# Patient Record
Sex: Male | Born: 1935 | Race: White | Hispanic: No | Marital: Married | Smoking: Never smoker
Health system: Southern US, Community
[De-identification: ages and names within clinical notes are randomized; demographics above are authoritative.]

## PROBLEM LIST (undated history)

## (undated) DIAGNOSIS — I219 Acute myocardial infarction, unspecified: Secondary | ICD-10-CM

## (undated) DIAGNOSIS — Z789 Other specified health status: Secondary | ICD-10-CM

## (undated) HISTORY — PX: LUNG LOBECTOMY: SHX167

## (undated) HISTORY — PX: CORONARY ARTERY BYPASS GRAFT: SHX141

## (undated) HISTORY — DX: Other specified health status: Z78.9

## (undated) HISTORY — DX: Acute myocardial infarction, unspecified: I21.9

---

## 2018-03-26 DIAGNOSIS — I739 Peripheral vascular disease, unspecified: Secondary | ICD-10-CM | POA: Insufficient documentation

## 2018-03-26 DIAGNOSIS — M545 Low back pain, unspecified: Secondary | ICD-10-CM | POA: Insufficient documentation

## 2018-03-26 DIAGNOSIS — E039 Hypothyroidism, unspecified: Secondary | ICD-10-CM | POA: Insufficient documentation

## 2018-03-26 DIAGNOSIS — I10 Essential (primary) hypertension: Secondary | ICD-10-CM | POA: Insufficient documentation

## 2018-03-26 DIAGNOSIS — F4310 Post-traumatic stress disorder, unspecified: Secondary | ICD-10-CM | POA: Insufficient documentation

## 2018-03-26 DIAGNOSIS — I2510 Atherosclerotic heart disease of native coronary artery without angina pectoris: Secondary | ICD-10-CM | POA: Insufficient documentation

## 2018-03-26 DIAGNOSIS — R55 Syncope and collapse: Secondary | ICD-10-CM | POA: Insufficient documentation

## 2018-03-26 DIAGNOSIS — E785 Hyperlipidemia, unspecified: Secondary | ICD-10-CM | POA: Insufficient documentation

## 2018-03-26 DIAGNOSIS — Z951 Presence of aortocoronary bypass graft: Secondary | ICD-10-CM | POA: Insufficient documentation

## 2018-09-14 ENCOUNTER — Other Ambulatory Visit: Payer: Self-pay | Admitting: Family Medicine

## 2018-09-14 ENCOUNTER — Other Ambulatory Visit (HOSPITAL_COMMUNITY): Payer: Self-pay | Admitting: Family Medicine

## 2018-09-14 DIAGNOSIS — I6529 Occlusion and stenosis of unspecified carotid artery: Secondary | ICD-10-CM

## 2018-09-28 ENCOUNTER — Other Ambulatory Visit: Payer: Self-pay

## 2018-09-28 ENCOUNTER — Ambulatory Visit (HOSPITAL_COMMUNITY)
Admission: RE | Admit: 2018-09-28 | Discharge: 2018-09-28 | Disposition: A | Discharge status: Home / Self Care | Payer: No Typology Code available for payment source | Source: Ambulatory Visit | Attending: Family Medicine | Admitting: Family Medicine

## 2018-09-28 DIAGNOSIS — I6529 Occlusion and stenosis of unspecified carotid artery: Secondary | ICD-10-CM | POA: Diagnosis present

## 2020-01-23 IMAGING — US BILATERAL CAROTID DUPLEX ULTRASOUND
1 series · 13 of 24 positions shown · non-contrast
Comparison: None.

CLINICAL DATA: Coronary artery disease, hyperlipidemia, previous
tobacco abuse, evaluate for stenosis

EXAM:
BILATERAL CAROTID DUPLEX ULTRASOUND
TECHNIQUE: Gray scale imaging, color Doppler and duplex ultrasound were
performed of bilateral carotid and vertebral arteries in the neck.

[Series 1: bilateral carotid duplex ultrasound · 13 of 68 slices shown]
[im 1/68]
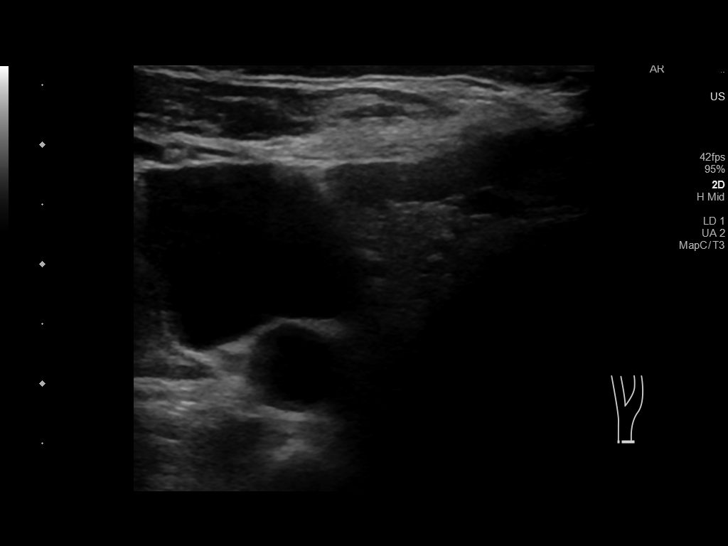
[im 6/68]
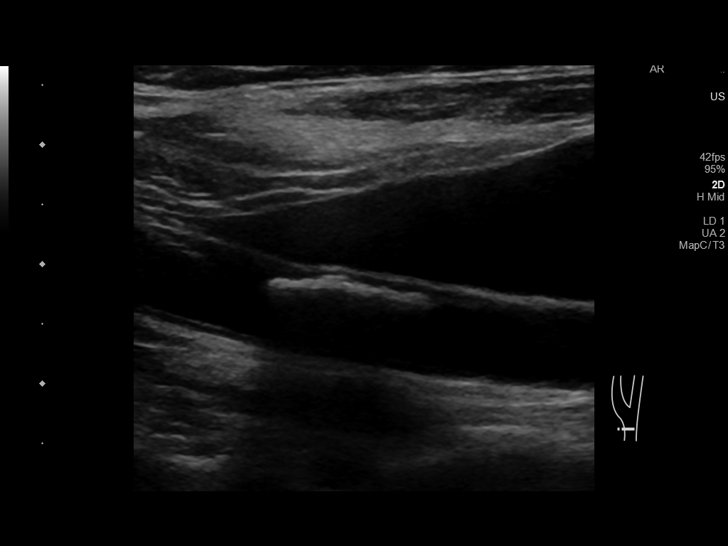
[im 12/68]
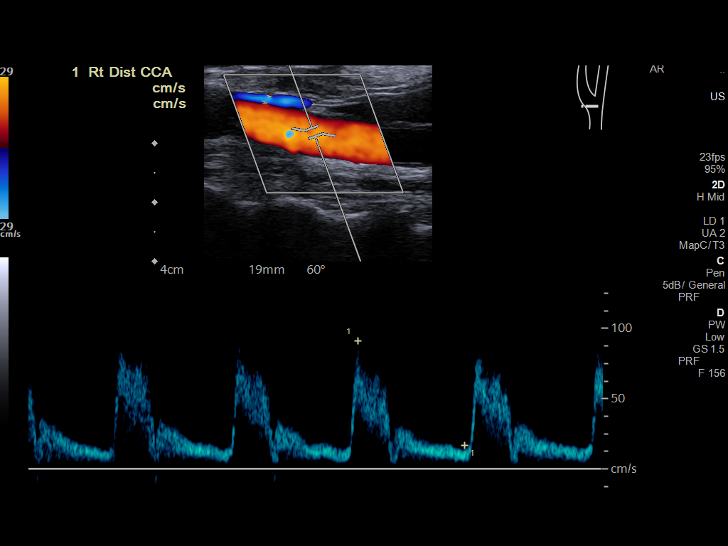
[im 18/68]
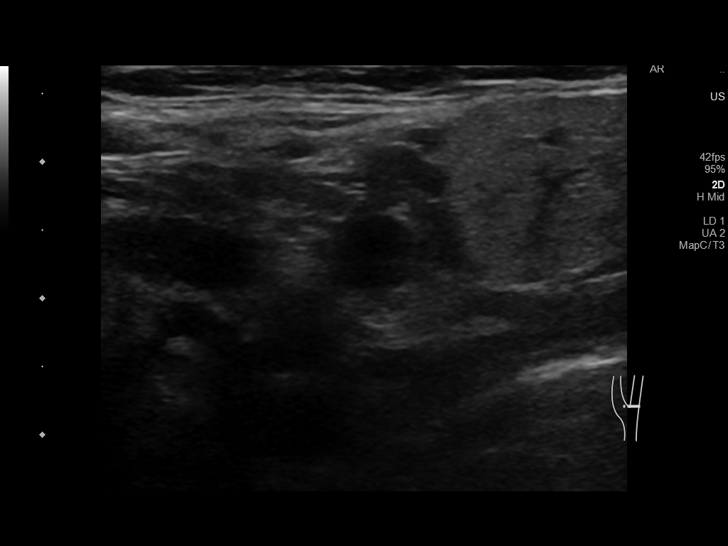
[im 24/68]
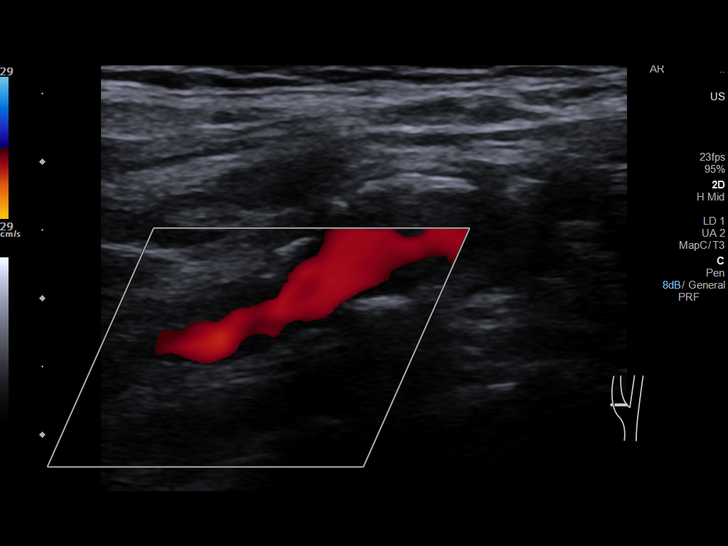
[im 30/68]
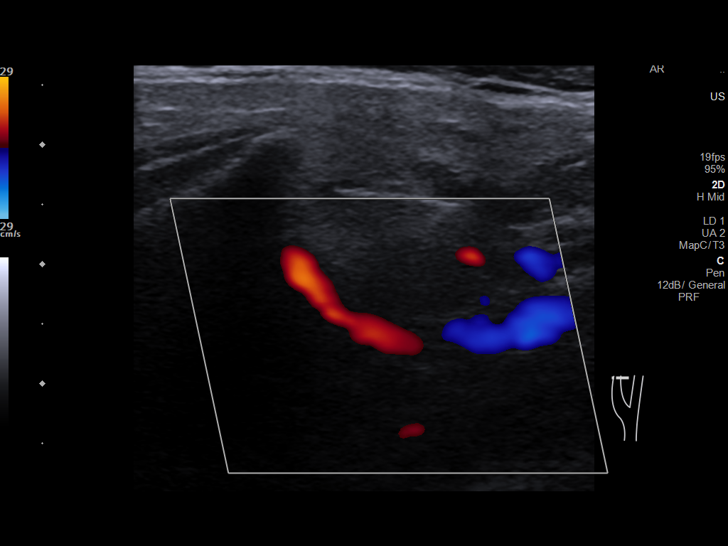
[im 35/68]
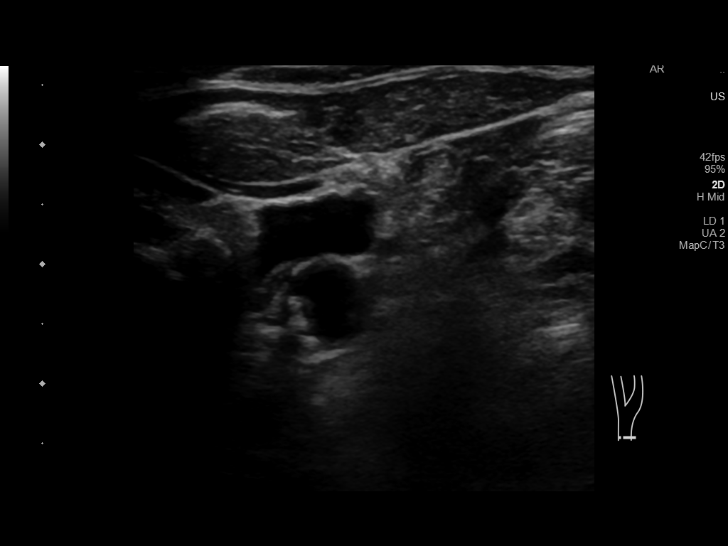
[im 38/68]
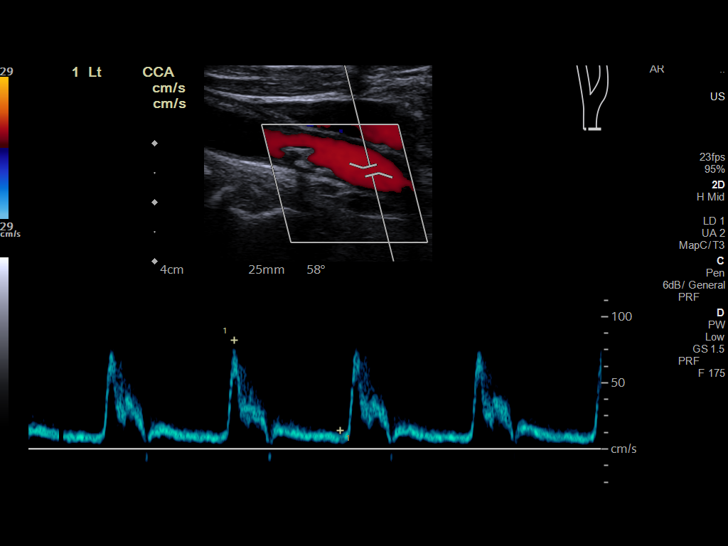
[im 44/68]
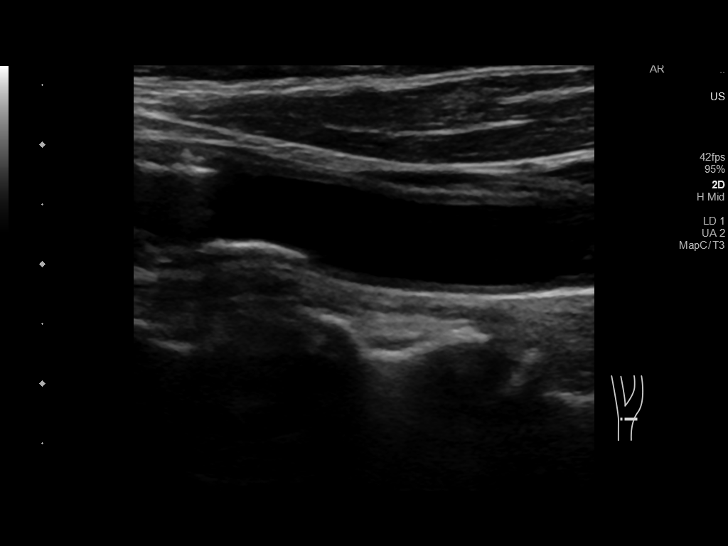
[im 50/68]
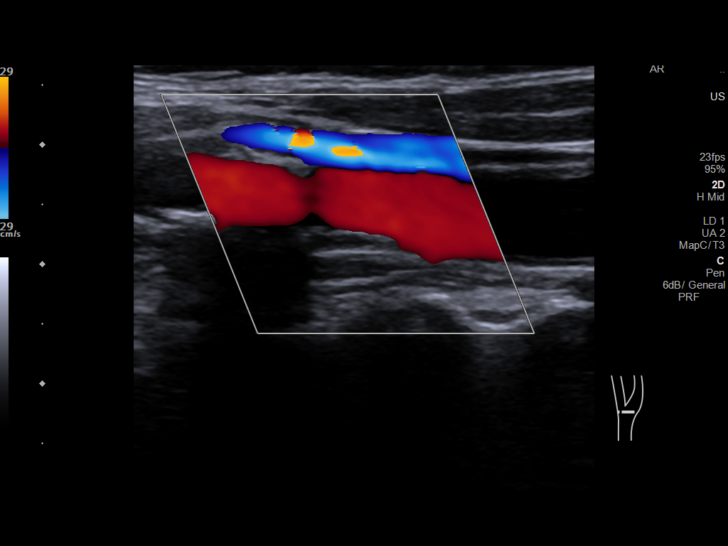
[im 56/68]
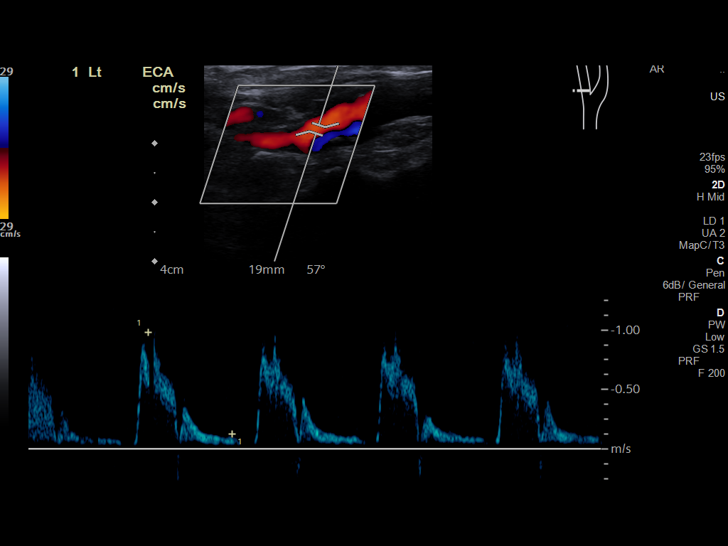
[im 62/68]
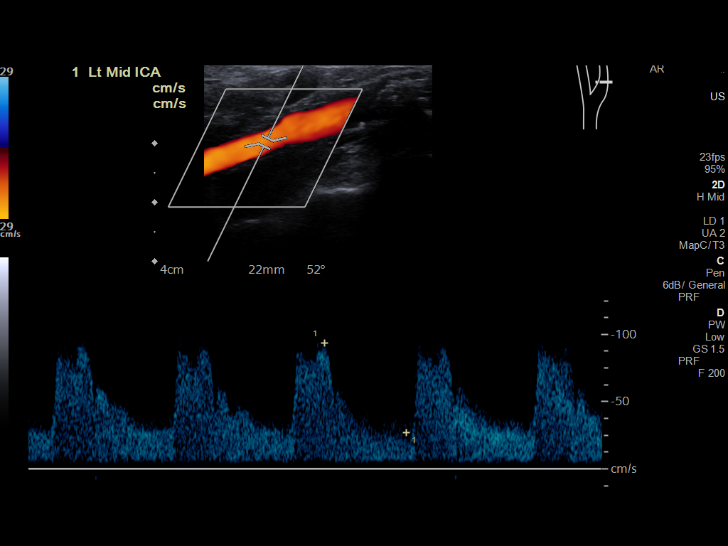
[im 68/68]
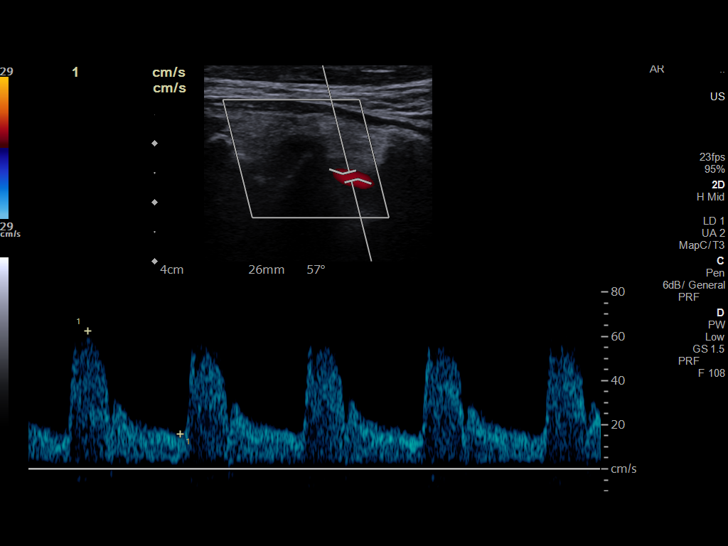

[13 of 24 positions shown; findings below may reference images not displayed]

FINDINGS: Criteria: Quantification of carotid stenosis is based on velocity
parameters that correlate the residual internal carotid diameter
with NASCET-based stenosis levels, using the diameter of the distal
internal carotid lumen as the denominator for stenosis measurement.

The following velocity measurements were obtained:

RIGHT

ICA: 120/31 cm/sec

CCA: 81/14 cm/sec

SYSTOLIC ICA/CCA RATIO:

ECA: 175 cm/sec

LEFT

ICA: 112/38 cm/sec

CCA: 98/21 cm/sec

SYSTOLIC ICA/CCA RATIO:

ECA: 98 cm/sec

RIGHT CAROTID ARTERY: Eccentric nonocclusive calcified plaque in the
mid and distal common carotid artery. Moderate calcified plaque in
the bulb extending into proximal internal and external carotid
arteries resulting in at least mild stenosis. Normal waveforms and
color Doppler signal throughout however. Mildly tortuous ICA.

RIGHT VERTEBRAL ARTERY:  Normal flow direction and waveform.

LEFT CAROTID ARTERY: Eccentric partially calcified plaque in the
proximal common carotid artery resulting in at least mild stenosis.
Partially calcified plaque in the bulb extending to the ICA origin
resulting in at least mild focal stenosis. Normal waveforms and
color Doppler signal throughout however.

LEFT VERTEBRAL ARTERY:  Normal flow direction and waveform.
IMPRESSION: 1. Bilateral carotid bifurcation plaque resulting in less than 50%
diameter ICA stenosis.
2. Antegrade bilateral vertebral arterial flow.

## 2021-10-25 ENCOUNTER — Other Ambulatory Visit: Payer: Self-pay | Admitting: *Deleted

## 2021-10-25 DIAGNOSIS — K431 Incisional hernia with gangrene: Secondary | ICD-10-CM

## 2021-10-28 ENCOUNTER — Ambulatory Visit (INDEPENDENT_AMBULATORY_CARE_PROVIDER_SITE_OTHER): Payer: No Typology Code available for payment source | Admitting: General Surgery

## 2021-10-28 ENCOUNTER — Encounter: Payer: Self-pay | Admitting: General Surgery

## 2021-10-28 VITALS — BP 147/81 | HR 76 | Temp 98.0°F | Resp 18 | Ht 68.0 in | Wt 168.0 lb

## 2021-10-28 DIAGNOSIS — K4090 Unilateral inguinal hernia, without obstruction or gangrene, not specified as recurrent: Secondary | ICD-10-CM | POA: Diagnosis not present

## 2021-10-28 NOTE — Progress Notes (Signed)
Luis Fletcher; [id number]; Aug 24, 1929   HPI Patient is an 88 year old white male who was referred to my care by Dr. Jolaine Click of the [facility] for evaluation and treatment of a right inguinal hernia.  Patient states he recently noted the hernia this summer.  It is somewhat uncomfortable, but he is able to get along with his daily lifestyle.  He denies any nausea or vomiting.  No history of incarceration has been noted.  He does have a cardiac history, though any recent work-up or follow-up is limited.  He denies any recent shortness of breath, chest pain, or dyspnea.  He has had some dizziness especially in the evening when sitting up or standing from a sitting position. Past Medical History:  Diagnosis Date   Myocardial infarction (HCC)    Refusal of blood product     Past Surgical History:  Procedure Laterality Date   CORONARY ARTERY BYPASS GRAFT     LUNG LOBECTOMY Left     History reviewed. No pertinent family history.  Current Outpatient Medications on File Prior to Visit  Medication Sig Dispense Refill   aspirin EC 81 MG tablet ONE TABLET BY MOUTH EVERY DAY     Cholecalciferol 100 MCG (4000 UT) CAPS Take 1 tablet by mouth daily.     levothyroxine (SYNTHROID) 100 MCG tablet TAKE ONE TABLET BY MOUTH EVERY MORNING FOR THYROID. RECHECK THYROID LEVELS IN 6-8 WEEKS     PARoxetine (PAXIL) 30 MG tablet Take 1 tablet by mouth at bedtime.     No current facility-administered medications on file prior to visit.    Allergies  Allergen Reactions   Penicillin G    Sulfa Antibiotics Rash    Social History   Substance and Sexual Activity  Alcohol Use Never    Social History   Tobacco Use  Smoking Status Never   Passive exposure: Never  Smokeless Tobacco Never    Review of Systems  Constitutional: Negative.   HENT: Negative.    Eyes: Negative.   Respiratory: Negative.    Cardiovascular: Negative.   Gastrointestinal: Negative.   Genitourinary:   Positive for frequency.  Musculoskeletal:  Positive for back pain, joint pain and neck pain.  Skin: Negative.   Neurological:  Positive for dizziness.  Endo/Heme/Allergies: Negative.   Psychiatric/Behavioral: Negative.      Objective   Vitals:   10/28/21 1109  BP: (!) 147/81  Pulse: 76  Resp: 18  Temp: 98 F (36.7 C)  SpO2: 96%    Physical Exam Vitals reviewed.  Constitutional:      Appearance: Normal appearance. He is normal weight. He is not ill-appearing.  HENT:     Head: Normocephalic and atraumatic.  Cardiovascular:     Rate and Rhythm: Normal rate and regular rhythm.     Heart sounds: Normal heart sounds. No murmur heard.    No friction rub. No gallop.  Pulmonary:     Effort: Pulmonary effort is normal. No respiratory distress.     Breath sounds: Normal breath sounds. No stridor. No wheezing, rhonchi or rales.  Abdominal:     General: Bowel sounds are normal. There is no distension.     Palpations: Abdomen is soft. There is no mass.     Tenderness: There is no abdominal tenderness. There is no guarding or rebound.     Hernia: A hernia is present.     Comments: An easily reducible right inguinal hernia is noted.  Genitourinary:    Testes: Normal.  Skin:  General: Skin is warm and dry.  Neurological:     Mental Status: He is alert and oriented to person, place, and time.    [facility] reviewed Assessment  Minimally symptomatic right inguinal hernia New onset dizziness, history of cardiac disease, hypertension Plan  I told the patient that I do not recommend hernia repair unless absolutely necessary and he is more symptomatic.  He is going to get a cardiac evaluation in the near future.  I told him to have the cardiologist send me any work-up they may do.  Further surgical intervention is dependent on how symptomatic he is and what his cardiac status is.  He understands this and agrees.  Literature was given.  Instructions on how to reduce an incarcerated hernia  were given.  Follow-up here expectantly.

## 2023-02-15 ENCOUNTER — Other Ambulatory Visit (HOSPITAL_COMMUNITY): Payer: Self-pay | Admitting: Internal Medicine

## 2023-02-15 DIAGNOSIS — K4090 Unilateral inguinal hernia, without obstruction or gangrene, not specified as recurrent: Secondary | ICD-10-CM

## 2023-03-02 ENCOUNTER — Ambulatory Visit (HOSPITAL_COMMUNITY)
Admission: RE | Admit: 2023-03-02 | Discharge: 2023-03-02 | Disposition: A | Discharge status: Home / Self Care | Payer: No Typology Code available for payment source | Source: Ambulatory Visit | Attending: Internal Medicine | Admitting: Internal Medicine

## 2023-03-02 DIAGNOSIS — K4090 Unilateral inguinal hernia, without obstruction or gangrene, not specified as recurrent: Secondary | ICD-10-CM | POA: Insufficient documentation

## 2023-03-02 MED ORDER — IOHEXOL 300 MG/ML  SOLN
100.0000 mL | Freq: Once | INTRAMUSCULAR | Status: AC | PRN
  Administered 2023-03-02: 100 mL via INTRAVENOUS

## 2023-10-25 ENCOUNTER — Emergency Department (HOSPITAL_COMMUNITY)

## 2023-10-25 ENCOUNTER — Inpatient Hospital Stay (HOSPITAL_COMMUNITY)
Admission: EM | Admit: 2023-10-25 | Discharge: 2023-10-27 | DRG: 871 | Disposition: A | Discharge status: Home / Self Care | Attending: Internal Medicine | Admitting: Internal Medicine

## 2023-10-25 ENCOUNTER — Other Ambulatory Visit: Payer: Self-pay

## 2023-10-25 DIAGNOSIS — J181 Lobar pneumonia, unspecified organism: Secondary | ICD-10-CM | POA: Diagnosis not present

## 2023-10-25 DIAGNOSIS — F32A Depression, unspecified: Secondary | ICD-10-CM | POA: Diagnosis not present

## 2023-10-25 DIAGNOSIS — Z951 Presence of aortocoronary bypass graft: Secondary | ICD-10-CM | POA: Diagnosis not present

## 2023-10-25 DIAGNOSIS — R509 Fever, unspecified: Secondary | ICD-10-CM

## 2023-10-25 DIAGNOSIS — K5730 Diverticulosis of large intestine without perforation or abscess without bleeding: Secondary | ICD-10-CM | POA: Diagnosis not present

## 2023-10-25 DIAGNOSIS — E782 Mixed hyperlipidemia: Secondary | ICD-10-CM | POA: Diagnosis not present

## 2023-10-25 DIAGNOSIS — F419 Anxiety disorder, unspecified: Secondary | ICD-10-CM | POA: Diagnosis present

## 2023-10-25 DIAGNOSIS — I252 Old myocardial infarction: Secondary | ICD-10-CM | POA: Diagnosis not present

## 2023-10-25 DIAGNOSIS — Z7982 Long term (current) use of aspirin: Secondary | ICD-10-CM | POA: Diagnosis not present

## 2023-10-25 DIAGNOSIS — A419 Sepsis, unspecified organism: Principal | ICD-10-CM | POA: Diagnosis present

## 2023-10-25 DIAGNOSIS — Z87891 Personal history of nicotine dependence: Secondary | ICD-10-CM | POA: Diagnosis not present

## 2023-10-25 DIAGNOSIS — I2510 Atherosclerotic heart disease of native coronary artery without angina pectoris: Secondary | ICD-10-CM | POA: Diagnosis not present

## 2023-10-25 DIAGNOSIS — Z79890 Hormone replacement therapy (postmenopausal): Secondary | ICD-10-CM

## 2023-10-25 DIAGNOSIS — I739 Peripheral vascular disease, unspecified: Secondary | ICD-10-CM | POA: Diagnosis not present

## 2023-10-25 DIAGNOSIS — E039 Hypothyroidism, unspecified: Secondary | ICD-10-CM | POA: Diagnosis present

## 2023-10-25 DIAGNOSIS — I10 Essential (primary) hypertension: Secondary | ICD-10-CM | POA: Diagnosis not present

## 2023-10-25 DIAGNOSIS — Z888 Allergy status to other drugs, medicaments and biological substances status: Secondary | ICD-10-CM | POA: Diagnosis not present

## 2023-10-25 DIAGNOSIS — S0101XA Laceration without foreign body of scalp, initial encounter: Principal | ICD-10-CM | POA: Diagnosis present

## 2023-10-25 DIAGNOSIS — Z1152 Encounter for screening for COVID-19: Secondary | ICD-10-CM

## 2023-10-25 DIAGNOSIS — W19XXXA Unspecified fall, initial encounter: Secondary | ICD-10-CM | POA: Diagnosis present

## 2023-10-25 DIAGNOSIS — Z882 Allergy status to sulfonamides status: Secondary | ICD-10-CM | POA: Diagnosis not present

## 2023-10-25 DIAGNOSIS — Z79899 Other long term (current) drug therapy: Secondary | ICD-10-CM

## 2023-10-25 DIAGNOSIS — Z23 Encounter for immunization: Secondary | ICD-10-CM | POA: Diagnosis not present

## 2023-10-25 DIAGNOSIS — Z880 Allergy status to penicillin: Secondary | ICD-10-CM

## 2023-10-25 DIAGNOSIS — K8020 Calculus of gallbladder without cholecystitis without obstruction: Secondary | ICD-10-CM | POA: Diagnosis not present

## 2023-10-25 LAB — CBG MONITORING, ED: Glucose-Capillary: 121 mg/dL — ABNORMAL HIGH (ref 70–99)

## 2023-10-25 LAB — CBC WITH DIFFERENTIAL/PLATELET
Abs Immature Granulocytes: 0.04 K/uL (ref 0.00–0.07)
Basophils Absolute: 0 K/uL (ref 0.0–0.1)
Basophils Relative: 0 %
Eosinophils Absolute: 0 K/uL (ref 0.0–0.5)
Eosinophils Relative: 0 %
HCT: 40.8 % (ref 39.0–52.0)
Hemoglobin: 13.2 g/dL (ref 13.0–17.0)
Immature Granulocytes: 0 %
Lymphocytes Relative: 5 %
Lymphs Abs: 0.6 K/uL — ABNORMAL LOW (ref 0.7–4.0)
MCH: 29.8 pg (ref 26.0–34.0)
MCHC: 32.4 g/dL (ref 30.0–36.0)
MCV: 92.1 fL (ref 80.0–100.0)
Monocytes Absolute: 1.3 K/uL — ABNORMAL HIGH (ref 0.1–1.0)
Monocytes Relative: 11 %
Neutro Abs: 10.6 K/uL — ABNORMAL HIGH (ref 1.7–7.7)
Neutrophils Relative %: 84 %
Platelets: 223 K/uL (ref 150–400)
RBC: 4.43 MIL/uL (ref 4.22–5.81)
RDW: 13.1 % (ref 11.5–15.5)
WBC: 12.6 K/uL — ABNORMAL HIGH (ref 4.0–10.5)
nRBC: 0 % (ref 0.0–0.2)

## 2023-10-25 LAB — COMPREHENSIVE METABOLIC PANEL WITH GFR
ALT: 15 U/L (ref 0–44)
AST: 19 U/L (ref 15–41)
Albumin: 3.3 g/dL — ABNORMAL LOW (ref 3.5–5.0)
Alkaline Phosphatase: 20 U/L — ABNORMAL LOW (ref 38–126)
Anion gap: 12 (ref 5–15)
BUN: 13 mg/dL (ref 8–23)
CO2: 26 mmol/L (ref 22–32)
Calcium: 8.5 mg/dL — ABNORMAL LOW (ref 8.9–10.3)
Chloride: 96 mmol/L — ABNORMAL LOW (ref 98–111)
Creatinine, Ser: 0.84 mg/dL (ref 0.61–1.24)
GFR, Estimated: >60 mL/min (ref >60)
Glucose, Bld: 136 mg/dL — ABNORMAL HIGH (ref 70–99)
Potassium: 4.1 mmol/L (ref 3.5–5.1)
Sodium: 134 mmol/L — ABNORMAL LOW (ref 135–145)
Total Bilirubin: 0.7 mg/dL (ref 0.0–1.2)
Total Protein: 7 g/dL (ref 6.5–8.1)

## 2023-10-25 LAB — URINALYSIS, W/ REFLEX TO CULTURE (INFECTION SUSPECTED)
Bacteria, UA: NONE SEEN
Bilirubin Urine: NEGATIVE
Glucose, UA: NEGATIVE mg/dL
Ketones, ur: 5 mg/dL — AB
Leukocytes,Ua: NEGATIVE
Nitrite: NEGATIVE
Protein, ur: 30 mg/dL — AB
Specific Gravity, Urine: 1.016 (ref 1.005–1.030)
pH: 6 (ref 5.0–8.0)

## 2023-10-25 LAB — RESP PANEL BY RT-PCR (RSV, FLU A&B, COVID)  RVPGX2
Influenza A by PCR: NEGATIVE
Influenza B by PCR: NEGATIVE
Resp Syncytial Virus by PCR: NEGATIVE
SARS Coronavirus 2 by RT PCR: NEGATIVE

## 2023-10-25 LAB — LIPASE, BLOOD: Lipase: 25 U/L (ref 11–51)

## 2023-10-25 LAB — TROPONIN I (HIGH SENSITIVITY)
Troponin I (High Sensitivity): 11 ng/L (ref <18)
Troponin I (High Sensitivity): 11 ng/L (ref <18)

## 2023-10-25 LAB — CK: Total CK: 160 U/L (ref 49–397)

## 2023-10-25 LAB — LACTIC ACID, PLASMA: Lactic Acid, Venous: 1.4 mmol/L (ref 0.5–1.9)

## 2023-10-25 MED ORDER — ASPIRIN 81 MG PO TBEC
81.0000 mg | DELAYED_RELEASE_TABLET | Freq: Every day | ORAL | Status: DC
  Administered 2023-10-26 – 2023-10-27 (×2): 81 mg via ORAL
  Filled 2023-10-25 (×2): qty 1

## 2023-10-25 MED ORDER — ACETAMINOPHEN 650 MG RE SUPP
650.0000 mg | Freq: Four times a day (QID) | RECTAL | Status: DC | PRN
Start: 2023-10-25 — End: 2023-10-27

## 2023-10-25 MED ORDER — BUSPIRONE HCL 5 MG PO TABS
5.0000 mg | ORAL_TABLET | Freq: Three times a day (TID) | ORAL | Status: DC
  Administered 2023-10-25 – 2023-10-27 (×5): 5 mg via ORAL
  Filled 2023-10-25 (×5): qty 1

## 2023-10-25 MED ORDER — ONDANSETRON HCL 4 MG PO TABS: 4.0000 mg | ORAL_TABLET | Freq: Four times a day (QID) | ORAL | Status: DC | PRN

## 2023-10-25 MED ORDER — LEVOTHYROXINE SODIUM 100 MCG PO TABS
100.0000 ug | ORAL_TABLET | Freq: Every day | ORAL | Status: DC
  Administered 2023-10-26 – 2023-10-27 (×2): 100 ug via ORAL
  Filled 2023-10-25 (×2): qty 1

## 2023-10-25 MED ORDER — ACETAMINOPHEN 325 MG PO TABS
650.0000 mg | ORAL_TABLET | Freq: Four times a day (QID) | ORAL | Status: DC | PRN
Start: 2023-10-25 — End: 2023-10-27

## 2023-10-25 MED ORDER — SODIUM CHLORIDE 0.9 % IV SOLN
1.0000 g | Freq: Once | INTRAVENOUS | Status: AC
  Administered 2023-10-25: 1 g via INTRAVENOUS
  Filled 2023-10-25: qty 10

## 2023-10-25 MED ORDER — LACTATED RINGERS IV BOLUS
500.0000 mL | Freq: Once | INTRAVENOUS | Status: AC
  Administered 2023-10-25: 500 mL via INTRAVENOUS

## 2023-10-25 MED ORDER — ENOXAPARIN SODIUM 40 MG/0.4ML IJ SOSY
40.0000 mg | PREFILLED_SYRINGE | INTRAMUSCULAR | Status: DC
  Administered 2023-10-26 – 2023-10-27 (×2): 40 mg via SUBCUTANEOUS
  Filled 2023-10-25 (×2): qty 0.4

## 2023-10-25 MED ORDER — AZITHROMYCIN 250 MG PO TABS
500.0000 mg | ORAL_TABLET | Freq: Every day | ORAL | Status: DC
  Administered 2023-10-25 – 2023-10-27 (×3): 500 mg via ORAL
  Filled 2023-10-25 (×3): qty 2

## 2023-10-25 MED ORDER — TETANUS-DIPHTH-ACELL PERTUSSIS 5-2.5-18.5 LF-MCG/0.5 IM SUSY
0.5000 mL | PREFILLED_SYRINGE | Freq: Once | INTRAMUSCULAR | Status: AC
  Administered 2023-10-25: 0.5 mL via INTRAMUSCULAR
  Filled 2023-10-25: qty 0.5

## 2023-10-25 MED ORDER — ONDANSETRON HCL 4 MG/2ML IJ SOLN: 4.0000 mg | Freq: Four times a day (QID) | INTRAMUSCULAR | Status: DC | PRN

## 2023-10-25 MED ORDER — ACETAMINOPHEN 325 MG PO TABS
650.0000 mg | ORAL_TABLET | Freq: Once | ORAL | Status: AC
  Administered 2023-10-25: 650 mg via ORAL
  Filled 2023-10-25: qty 2

## 2023-10-25 MED ORDER — PAROXETINE HCL 20 MG PO TABS
30.0000 mg | ORAL_TABLET | Freq: Every day | ORAL | Status: DC
  Administered 2023-10-25 – 2023-10-26 (×2): 30 mg via ORAL
  Filled 2023-10-25 (×2): qty 1

## 2023-10-25 MED ORDER — SODIUM CHLORIDE 0.9 % IV SOLN
1.0000 g | INTRAVENOUS | Status: DC
  Administered 2023-10-26 – 2023-10-27 (×2): 1 g via INTRAVENOUS
  Filled 2023-10-25 (×2): qty 10

## 2023-10-25 NOTE — ED Triage Notes (Signed)
 Pt arrived via [facility] from home c/o a fall in which he hit his head, denies use of blood thinners, denies LOC, denies HA, laceration noted to head. Pt does endorse feeling generalized weakness and shob that has been going on, EDP at bedside

## 2023-10-25 NOTE — H&P (Signed)
 History and Physical    Patient: Luis Fletcher FMW:[id number] DOB: 31-Aug-1929 DOA: 10/25/2023 DOS: the patient was seen and examined on 10/25/2023 PCP: Aimee Cons, MD  Patient coming from: Home  Chief Complaint:  Chief Complaint  Patient presents with   Fall   HPI: Luis Fletcher is a 89 year old male with history of coronary disease status post CABG 1998, hypertension, hyperlipidemia, anxiety/depression presenting with 3-day history of generalized weakness and poor p.o. intake.  He states that he began developing some left lower rib pain after changing some tires on his car on 10/22/2023.  He denies any frank shortness of breath or coughing or hemoptysis.  He denies any fevers, chills, headache, chest pain, nausea, vomiting, diarrhea, abdominal pain, dysuria, hematuria.  Has had some loose stools over the past 3 days.  He quit smoking in 1961.  In the early morning 10/25/2023, the patient was feeling unsteady and weak going to the bathroom.  He fell, back to the bedroom.  He subsequently laid on the bed until the morning.  He eventually was strong enough to get up and unlock the door for his friend who came to visit him and check up on him.  EMS was activated when it was noticed the patient had a laceration on his scalp.  The patient denied loss of consciousness.  He denies any new medications. In the ED, the patient was febrile up to 101.0 F.  He was hemodynamically stable with oxygen saturation 94-97% room air.  WBC 12.6, hemoglobin 13.2, platelets 223.  Sodium 134, potassium 4.1, bicarbonate 26, serum creatinine 0.84.  LFTs were unremarkable.  Lipase 25.  Lactic acid 1.4.  Troponin 11>> 11.  CT chest abdomen and pelvis showed lateral left upper lobe and lingular consolidations.  There was cholelithiasis without any ductal dilatation.  There was diverticulosis.  CT of the brain was negative for any acute intracranial findings.  CT of the cervical spine was negative for fracture or listhesis.  The  patient was started on ceftriaxone .  He was admitted for further evaluation and treatment.  Review of Systems: As mentioned in the history of present illness. All other systems reviewed and are negative. Past Medical History:  Diagnosis Date   Myocardial infarction (HCC)    Refusal of blood product    Past Surgical History:  Procedure Laterality Date   CORONARY ARTERY BYPASS GRAFT     LUNG LOBECTOMY Left    Social History:  reports that he has never smoked. He has never been exposed to tobacco smoke. He has never used smokeless tobacco. He reports that he does not drink alcohol and does not use drugs.  Allergies  Allergen Reactions   Penicillin G Other (See Comments)    Unknown   Sulfa Antibiotics Rash    No family history on file.  Prior to Admission medications   Medication Sig Start Date End Date Taking? Authorizing Provider  busPIRone  (BUSPAR ) 5 MG tablet Take 5 mg by mouth 3 (three) times daily.   Yes [provider]  aspirin  EC 81 MG tablet Take 81 mg by mouth daily.    [provider]  Cholecalciferol 100 MCG (4000 UT) CAPS Take 1 tablet by mouth daily.    [provider]  levothyroxine  (SYNTHROID ) 100 MCG tablet Take 100 mcg by mouth daily before breakfast. 09/08/17   [provider]  PARoxetine  (PAXIL ) 30 MG tablet Take 1 tablet by mouth at bedtime. 12/20/17   [provider]    Physical Exam: Vitals:  10/25/23 1135 10/25/23 1145 10/25/23 1200 10/25/23 1400  BP:   132/85 (!) 117/59  Pulse:  80 73 68  Resp:   17 18  Temp: (!) 101 F (38.3 C)     TempSrc: Rectal     SpO2:  97% 94% 97%   GENERAL:  A&O x 3, NAD, well developed, cooperative, follows commands HEENT: [place]/AT, No thrush, No icterus, No oral ulcers Neck:  No neck mass, No meningismus, soft, supple CV: RRR, no S3, no S4, no rub, no JVD Lungs: Diminished breath sound bilateral.  Left-sided Abd: soft/NT +BS, nondistended Ext: No edema, no lymphangitis, no  cyanosis, no rashes Neuro:  CN II-XII intact, strength 4/5 in RUE, RLE, strength 4/5 LUE, LLE; sensation intact bilateral; no dysmetria; babinski equivocal  Data Reviewed: Data reviewed above in history  Assessment and Plan: Sepsis  - Presented with fever and leukocytosis - Secondary to pneumonia - Lactic acid 1.4 - Continue ceftriaxone  - Add azithromycin  - Follow blood cultures - UA 6-10 WBC  Lobar pneumonia - 10/25/2023 CT chest as discussed above - Urine Legionella urine - Check PCT - Urine Streptococcus pneumonia antigen -Continue ceftriaxone , add azithromycin   Coronary disease -No chest pain presently - Troponin 11>>11  Mixed hyperlipidemia - Continue statin  Hypothyroidism - Continue Synthroid   Anxiety/depression - Continue BuSpar  and Paxil        Advance Care Planning: FULL  Consults: none  Family Communication: none present  Severity of Illness: The appropriate patient status for this patient is OBSERVATION. Observation status is judged to be reasonable and necessary in order to provide the required intensity of service to ensure the patient's safety. The patient's presenting symptoms, physical exam findings, and initial radiographic and laboratory data in the context of their medical condition is felt to place them at decreased risk for further clinical deterioration. Furthermore, it is anticipated that the patient will be medically stable for discharge from the hospital within 2 midnights of admission.   Author: Alm Schneider, MD 10/25/2023 2:58 PM  For on call review [email or web address].

## 2023-10-25 NOTE — ED Notes (Signed)
 Pts friend Kristan Moon) requesting to be notified of the patients bed assignment. Phone number [phone]

## 2023-10-25 NOTE — ED Provider Notes (Signed)
 [facility] AT [facility] Provider Note   CSN: [id number] Arrival date & time: 10/25/23  1010     Patient presents with: [place]   Luis Fletcher is a 89 y.o. male.   HPI 89 year old male with a history of hypothyroidism, previous CABG, hypertension, PVD presents after a fall.  History is from patient and EMS.  Around 2 in the morning he got up to go to the bathroom and felt off balance and fell.  He does not think he lost consciousness before or after.  He apparently hit his head and went back to bed.  When a neighbor checked on him they noticed his scalp laceration and called EMS.  Patient states he has been feeling diffusely weak/no energy and poor appetite for about 3 days.  He denies fevers.  He has been having pain in his left lower chest with deep inspiration for about 3 days as well.  No cough or shortness of breath.  No urinary symptoms.  He recently was outside and wonders if he got a heat related illness.  He is unsure when his last Tdap was.  He takes a baby aspirin  but no other blood thinners.  Prior to Admission medications   Medication Sig Start Date End Date Taking? Authorizing Provider  busPIRone  (BUSPAR ) 5 MG tablet Take 5 mg by mouth 3 (three) times daily.   Yes [provider]  aspirin  EC 81 MG tablet Take 81 mg by mouth daily.    [provider]  Cholecalciferol 100 MCG (4000 UT) CAPS Take 1 tablet by mouth daily.    [provider]  levothyroxine  (SYNTHROID ) 100 MCG tablet Take 100 mcg by mouth daily before breakfast. 09/08/17   [provider]  PARoxetine  (PAXIL ) 30 MG tablet Take 1 tablet by mouth at bedtime. 12/20/17   [provider]    Allergies: Penicillin g and Sulfa antibiotics    Review of Systems  Constitutional:  Negative for fever.  Respiratory:  Negative for cough.   Cardiovascular:  Positive for chest pain.  Gastrointestinal:  Negative for abdominal pain.  Genitourinary:   Negative for dysuria.  Musculoskeletal:  Negative for back pain and neck pain.  Neurological:  Positive for weakness. Negative for syncope and headaches.    Updated Vital Signs BP (!) 117/59 (BP Location: Right Arm)   Pulse 68   Temp (!) 101 F (38.3 C) (Rectal)   Resp 18   SpO2 97%   Physical Exam Vitals and nursing note reviewed.  Constitutional:      General: He is not in acute distress.    Appearance: He is well-developed. He is not ill-appearing or diaphoretic.  HENT:     Head: Normocephalic. Laceration present.     Comments: There is a laceration to the top of his scalp. Cardiovascular:     Rate and Rhythm: Normal rate and regular rhythm.     Heart sounds: Normal heart sounds.  Pulmonary:     Effort: Pulmonary effort is normal.     Breath sounds: Normal breath sounds.  Chest:     Chest wall: No tenderness.  Abdominal:     Palpations: Abdomen is soft.     Tenderness: There is no abdominal tenderness. There is no right CVA tenderness or left CVA tenderness.  Skin:    General: Skin is warm and dry.  Neurological:     Mental Status: He is alert and oriented to person, place, and time.     Comments:  CN 3-12 grossly intact. 5/5 strength in all 4 extremities. Grossly normal sensation.      (all labs ordered are listed, but only abnormal results are displayed) Labs Reviewed  COMPREHENSIVE METABOLIC PANEL WITH GFR - Abnormal; Notable for the following components:      Result Value   Sodium 134 (*)    Chloride 96 (*)    Glucose, Bld 136 (*)    Calcium 8.5 (*)    Albumin 3.3 (*)    Alkaline Phosphatase 20 (*)    All other components within normal limits  CBC WITH DIFFERENTIAL/PLATELET - Abnormal; Notable for the following components:   WBC 12.6 (*)    Neutro Abs 10.6 (*)    Lymphs Abs 0.6 (*)    Monocytes Absolute 1.3 (*)    All other components within normal limits  URINALYSIS, W/ REFLEX TO CULTURE (INFECTION SUSPECTED) - Abnormal; Notable for the following  components:   Hgb urine dipstick MODERATE (*)    Ketones, ur 5 (*)    Protein, ur 30 (*)    All other components within normal limits  CBG MONITORING, ED - Abnormal; Notable for the following components:   Glucose-Capillary 121 (*)    All other components within normal limits  RESP PANEL BY RT-PCR (RSV, FLU A&B, COVID)  RVPGX2  CULTURE, BLOOD (ROUTINE X 2)  CULTURE, BLOOD (ROUTINE X 2)  URINE CULTURE  CK  LIPASE, BLOOD  LACTIC ACID, PLASMA  TROPONIN I (HIGH SENSITIVITY)  TROPONIN I (HIGH SENSITIVITY)    EKG: EKG Interpretation Date/Time:  Wednesday October 25 2023 11:26:36 EDT Ventricular Rate:  80 PR Interval:  173 QRS Duration:  99 QT Interval:  370 QTC Calculation: 427 R Axis:   41  Text Interpretation: Sinus rhythm Inferior infarct, old  No old tracing to compare Confirmed by Freddi Hamilton [phone]) on 10/25/2023 11:46:25 AM  Radiology: CT CHEST ABDOMEN PELVIS WO CONTRAST Result Date: 10/25/2023 CLINICAL DATA:  Abdominal/flank pain, stone suspected EXAM: CT CHEST, ABDOMEN AND PELVIS WITHOUT CONTRAST TECHNIQUE: Multidetector CT imaging of the chest, abdomen and pelvis was performed following the standard protocol without IV contrast. RADIATION DOSE REDUCTION: This exam was performed according to the departmental dose-optimization program which includes automated exposure control, adjustment of the mA and/or kV according to patient size and/or use of iterative reconstruction technique. COMPARISON:  March 02, 2023 FINDINGS: Of note, the lack of intravenous contrast limits evaluation of the solid organ parenchyma and vascularity. CT CHEST FINDINGS Cardiovascular: No cardiomegaly or pericardial effusion.No aortic aneurysm. Diffuse aortic atherosclerosis with dense multi-vessel coronary atherosclerosis. Postsurgical changes of a prior CABG procedure. Mediastinum/Nodes: No mediastinal mass.No mediastinal, hilar, or axillary lymphadenopathy. Lungs/Pleura: The midline trachea and  bronchi are patent. Biapical pleuroparenchymal scarring. Fibrolinear scarring within the lingula and both lower lobes. Focal airspace consolidation with air bronchograms dependently and laterally within the left upper lobe and lingula. No pleural effusion or pneumothorax. Scattered calcified granulomas. 2 mm anterior left upper lobe nodule (axial 53), likely infectious or inflammatory. CT ABDOMEN PELVIS FINDINGS Hepatobiliary: No mass.Multiple small radiopaque gallstones. No wall thickening. No intrahepatic or extrahepatic biliary ductal dilation. Pancreas: No mass or main ductal dilation. No peripancreatic inflammation or fluid collection. Spleen: Normal size. No mass. Adrenals/Urinary Tract: No adrenal masses. No renal mass. No hydronephrosis or nephrolithiasis. The urinary bladder is distended without focal abnormality. Stomach/Bowel: The stomach is decompressed without focal abnormality. No small bowel wall thickening or inflammation. No small bowel obstruction.Normal appendix. Descending and sigmoid colonic diverticulosis. No changes of  acute diverticulitis. Vascular/Lymphatic: No aortic aneurysm. Diffuse aortoiliac atherosclerosis. No intraabdominal or pelvic lymphadenopathy. Reproductive: Borderline prostatomegaly.  No free pelvic fluid. Other: No pneumoperitoneum, ascites, or mesenteric inflammation. Musculoskeletal: No acute fracture or destructive lesion. Multilevel degenerative disc disease of the spine. Sternotomy wires. Osteopenia. 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 Postsurgical changes along the right inguinal region possibly due to interval hernia repair. No associated fluid collection. IMPRESSION: 1. Lateral left upper lobe and lingular airspace consolidation, favored to represent left upper lobe pneumonia. No parapneumonic effusion. 2. Cholecystolithiasis.  No changes of acute cholecystitis. 3. Descending and sigmoid colonic diverticulosis. No changes of acute diverticulitis. 4.  Borderline prostatomegaly. Aortic Atherosclerosis (ICD10-I70.0). Electronically Signed   By: Rogelia Myers M.D.   On: 10/25/2023 14:05   CT Head Wo Contrast Result Date: 10/25/2023 CLINICAL DATA:  Provided history: Head trauma, minor. Neck trauma. Additional history provided: Fall (with head trauma). Scalp laceration. EXAM: CT HEAD WITHOUT CONTRAST CT CERVICAL SPINE WITHOUT CONTRAST TECHNIQUE: Multidetector CT imaging of the head and cervical spine was performed following the standard protocol without intravenous contrast. Multiplanar CT image reconstructions of the cervical spine were also generated. RADIATION DOSE REDUCTION: This exam was performed according to the departmental dose-optimization program which includes automated exposure control, adjustment of the mA and/or kV according to patient size and/or use of iterative reconstruction technique. COMPARISON:  None. FINDINGS: CT HEAD FINDINGS Brain: Mild generalized cerebral atrophy. There is no acute intracranial hemorrhage. No demarcated cortical infarct. No extra-axial fluid collection. No evidence of an intracranial mass. No midline shift. Vascular: No hyperdense vessel.  Atherosclerotic calcifications. Skull: No calvarial fracture or aggressive osseous lesion. Sinuses/Orbits: No mass or acute finding within the imaged orbits. No significant paranasal sinus disease at the imaged levels. Other: Left frontoparietal scalp laceration. CT CERVICAL SPINE FINDINGS Alignment: Slight grade 1 anterolisthesis at C2-C3, C3-C4 and C4-C5. 4 mm C5-C6 grade 1 anterolisthesis. 3 mm C6-C7 grade 1 anterolisthesis. Slight C7-T1 grade 1 anterolisthesis. Skull base and vertebrae: The basion-dental and atlanto-dental intervals are maintained.No evidence of acute fracture to the cervical spine. Soft tissues and spinal canal: No prevertebral fluid or swelling. No visible canal hematoma. Disc levels: Cervical spondylosis with multilevel disc space narrowing, disc bulges/central  disc protrusions, uncovertebral hypertrophy and facet arthropathy. No appreciable high-grade spinal canal stenosis. Multilevel bony neural foraminal narrowing. Multilevel ventral osteophytes (including a fairly bulky bridging ventral osteophyte at C6-C7). Upper chest: No consolidation within the imaged lung apices. No visible pneumothorax. IMPRESSION: CT head: 1.  No evidence of an acute intracranial abnormality. 2. Left frontoparietal scalp laceration. 3. Mild generalized cerebral atrophy. CT cervical spine: 1. No evidence of an acute cervical spine fracture. 2. Grade 1 anterolisthesis at C2-C3, C3-C4, C4-C5, C5-C6, C6-C7 and C7-T1. 3. Cervical spondylosis as described. Electronically Signed   By: Rockey Childs D.O.   On: 10/25/2023 11:26   CT Cervical Spine Wo Contrast Result Date: 10/25/2023 CLINICAL DATA:  Provided history: Head trauma, minor. Neck trauma. Additional history provided: Fall (with head trauma). Scalp laceration. EXAM: CT HEAD WITHOUT CONTRAST CT CERVICAL SPINE WITHOUT CONTRAST TECHNIQUE: Multidetector CT imaging of the head and cervical spine was performed following the standard protocol without intravenous contrast. Multiplanar CT image reconstructions of the cervical spine were also generated. RADIATION DOSE REDUCTION: This exam was performed according to the departmental dose-optimization program which includes automated exposure control, adjustment of the mA and/or kV according to patient size and/or use of iterative reconstruction technique. COMPARISON:  None. FINDINGS: CT HEAD FINDINGS Brain: Mild generalized cerebral  atrophy. There is no acute intracranial hemorrhage. No demarcated cortical infarct. No extra-axial fluid collection. No evidence of an intracranial mass. No midline shift. Vascular: No hyperdense vessel.  Atherosclerotic calcifications. Skull: No calvarial fracture or aggressive osseous lesion. Sinuses/Orbits: No mass or acute finding within the imaged orbits. No significant  paranasal sinus disease at the imaged levels. Other: Left frontoparietal scalp laceration. CT CERVICAL SPINE FINDINGS Alignment: Slight grade 1 anterolisthesis at C2-C3, C3-C4 and C4-C5. 4 mm C5-C6 grade 1 anterolisthesis. 3 mm C6-C7 grade 1 anterolisthesis. Slight C7-T1 grade 1 anterolisthesis. Skull base and vertebrae: The basion-dental and atlanto-dental intervals are maintained.No evidence of acute fracture to the cervical spine. Soft tissues and spinal canal: No prevertebral fluid or swelling. No visible canal hematoma. Disc levels: Cervical spondylosis with multilevel disc space narrowing, disc bulges/central disc protrusions, uncovertebral hypertrophy and facet arthropathy. No appreciable high-grade spinal canal stenosis. Multilevel bony neural foraminal narrowing. Multilevel ventral osteophytes (including a fairly bulky bridging ventral osteophyte at C6-C7). Upper chest: No consolidation within the imaged lung apices. No visible pneumothorax. IMPRESSION: CT head: 1.  No evidence of an acute intracranial abnormality. 2. Left frontoparietal scalp laceration. 3. Mild generalized cerebral atrophy. CT cervical spine: 1. No evidence of an acute cervical spine fracture. 2. Grade 1 anterolisthesis at C2-C3, C3-C4, C4-C5, C5-C6, C6-C7 and C7-T1. 3. Cervical spondylosis as described. Electronically Signed   By: Rockey Childs D.O.   On: 10/25/2023 11:26   DG Chest 2 View Result Date: 10/25/2023 CLINICAL DATA:  Shortness of breath. EXAM: CHEST - 2 VIEW COMPARISON:  None Available. FINDINGS: The heart size and mediastinal contours are within normal limits. Right lung is clear. Mild left basilar scarring is noted. Status post coronary artery bypass graft. No acute osseous abnormality is noted. IMPRESSION: No active cardiopulmonary disease. Electronically Signed   By: Lynwood Landy Raddle M.D.   On: 10/25/2023 11:10     .Laceration Repair  Date/Time: 10/25/2023 1:09 PM  Performed by: Freddi Hamilton, MD Authorized by:  Freddi Hamilton, MD   Consent:    Consent obtained:  Verbal   Consent given by:  Patient   Risks, benefits, and alternatives were discussed: yes   Laceration details:    Location:  Scalp   Scalp location:  Crown   Length (cm):  8 Pre-procedure details:    Preparation:  Patient was prepped and draped in usual sterile fashion and imaging obtained to evaluate for foreign bodies Exploration:    Limited defect created (wound extended): no     Hemostasis achieved with:  Direct pressure Treatment:    Area cleansed with:  Saline Skin repair:    Repair method:  Staples   Number of staples:  7 Approximation:    Approximation:  Close Repair type:    Repair type:  Simple Post-procedure details:    Procedure completion:  Tolerated well, no immediate complications    Medications Ordered in the ED  lactated ringers  bolus 500 mL (0 mLs Intravenous Stopped 10/25/23 1216)  Tdap (BOOSTRIX ) injection 0.5 mL (0.5 mLs Intramuscular Given 10/25/23 1127)  acetaminophen  (TYLENOL ) tablet 650 mg (650 mg Oral Given 10/25/23 1144)  cefTRIAXone  (ROCEPHIN ) 1 g in sodium chloride  0.9 % 100 mL IVPB (0 g Intravenous Stopped 10/25/23 1353)                                    Medical Decision Making Amount and/or Complexity of Data Reviewed Labs:  ordered.    Details: Normal lactate.  Mild leukocytosis. Radiology: ordered.    Details: No head bleed ECG/medicine tests: ordered and independent interpretation performed.    Details: Sinus rhythm  Risk OTC drugs. Prescription drug management. Decision regarding hospitalization.   Patient presents after fall.  Has a scalp laceration.  Otherwise, he has a fever that is likely the source of his weakness.  Unclear source though with hematuria and some white blood cells it probably is a UTI.  There is a question of pneumonia but patient tells me he also had a partial lobectomy on the left side and is unclear if that is related.  However he is having left lower chest  pain as well.  ECG and troponins are benign.  He is hemodynamically stable and was given some gentle fluids.  Will start him on Rocephin .  Laceration repaired as above.  Otherwise, he lives at home alone and was having some weakness and trouble ambulating, will admit for supportive care and further workup/treatment.  Discussed with Dr. Evonnie.     Final diagnoses:  Laceration of scalp, initial encounter  Fever in adult    ED Discharge Orders     None          Freddi Hamilton, MD 10/25/23 [phone]

## 2023-10-25 NOTE — Hospital Course (Signed)
 89 year old male with history of coronary disease status post CABG 1998, hypertension, hyperlipidemia, anxiety/depression presenting with 3-day history of generalized weakness and poor p.o. intake.  He states that he began developing some left lower rib pain after changing some tires on his car on 10/22/2023.  He denies any frank shortness of breath or coughing or hemoptysis.  He denies any fevers, chills, headache, chest pain, nausea, vomiting, diarrhea, abdominal pain, dysuria, hematuria.  Has had some loose stools over the past 3 days.  He quit smoking in 1961.  In the early morning 10/25/2023, the patient was feeling unsteady and weak going to the bathroom.  He fell, back to the bedroom.  He subsequently laid on the bed until the morning.  He eventually was strong enough to get up and unlock the door for his friend who came to visit him and check up on him.  EMS was activated when it was noticed the patient had a laceration on his scalp.  The patient denied loss of consciousness.  He denies any new medications. In the ED, the patient was febrile up to 101.0 F.  He was hemodynamically stable with oxygen saturation 94-97% room air.  WBC 12.6, hemoglobin 13.2, platelets 223.  Sodium 134, potassium 4.1, bicarbonate 26, serum creatinine 0.84.  LFTs were unremarkable.  Lipase 25.  Lactic acid 1.4.  Troponin 11>> 11.  CT chest abdomen and pelvis showed lateral left upper lobe and lingular consolidations.  There was cholelithiasis without any ductal dilatation.  There was diverticulosis.  CT of the brain was negative for any acute intracranial findings.  CT of the cervical spine was negative for fracture or listhesis.  The patient was started on ceftriaxone .  He was admitted for further evaluation and treatment.

## 2023-10-25 NOTE — ED Notes (Signed)
 Pt provided dinner tray from dietary.

## 2023-10-26 ENCOUNTER — Encounter (HOSPITAL_COMMUNITY): Payer: Self-pay | Admitting: Internal Medicine

## 2023-10-26 DIAGNOSIS — E782 Mixed hyperlipidemia: Secondary | ICD-10-CM | POA: Diagnosis present

## 2023-10-26 DIAGNOSIS — J181 Lobar pneumonia, unspecified organism: Secondary | ICD-10-CM | POA: Diagnosis present

## 2023-10-26 DIAGNOSIS — S0101XA Laceration without foreign body of scalp, initial encounter: Secondary | ICD-10-CM | POA: Diagnosis present

## 2023-10-26 DIAGNOSIS — I2510 Atherosclerotic heart disease of native coronary artery without angina pectoris: Secondary | ICD-10-CM | POA: Diagnosis present

## 2023-10-26 DIAGNOSIS — E039 Hypothyroidism, unspecified: Secondary | ICD-10-CM | POA: Diagnosis present

## 2023-10-26 DIAGNOSIS — Z888 Allergy status to other drugs, medicaments and biological substances status: Secondary | ICD-10-CM | POA: Diagnosis not present

## 2023-10-26 DIAGNOSIS — A419 Sepsis, unspecified organism: Secondary | ICD-10-CM | POA: Diagnosis present

## 2023-10-26 DIAGNOSIS — F419 Anxiety disorder, unspecified: Secondary | ICD-10-CM | POA: Diagnosis present

## 2023-10-26 DIAGNOSIS — Z7982 Long term (current) use of aspirin: Secondary | ICD-10-CM | POA: Diagnosis not present

## 2023-10-26 DIAGNOSIS — I739 Peripheral vascular disease, unspecified: Secondary | ICD-10-CM | POA: Diagnosis present

## 2023-10-26 DIAGNOSIS — R509 Fever, unspecified: Secondary | ICD-10-CM | POA: Diagnosis present

## 2023-10-26 DIAGNOSIS — Z23 Encounter for immunization: Secondary | ICD-10-CM | POA: Diagnosis not present

## 2023-10-26 DIAGNOSIS — Z1152 Encounter for screening for COVID-19: Secondary | ICD-10-CM | POA: Diagnosis not present

## 2023-10-26 DIAGNOSIS — K5730 Diverticulosis of large intestine without perforation or abscess without bleeding: Secondary | ICD-10-CM | POA: Diagnosis present

## 2023-10-26 DIAGNOSIS — Z951 Presence of aortocoronary bypass graft: Secondary | ICD-10-CM | POA: Diagnosis not present

## 2023-10-26 DIAGNOSIS — Z79890 Hormone replacement therapy (postmenopausal): Secondary | ICD-10-CM | POA: Diagnosis not present

## 2023-10-26 DIAGNOSIS — K8020 Calculus of gallbladder without cholecystitis without obstruction: Secondary | ICD-10-CM | POA: Diagnosis present

## 2023-10-26 DIAGNOSIS — I252 Old myocardial infarction: Secondary | ICD-10-CM | POA: Diagnosis not present

## 2023-10-26 DIAGNOSIS — Z882 Allergy status to sulfonamides status: Secondary | ICD-10-CM | POA: Diagnosis not present

## 2023-10-26 DIAGNOSIS — Z880 Allergy status to penicillin: Secondary | ICD-10-CM | POA: Diagnosis not present

## 2023-10-26 DIAGNOSIS — Z87891 Personal history of nicotine dependence: Secondary | ICD-10-CM | POA: Diagnosis not present

## 2023-10-26 DIAGNOSIS — F32A Depression, unspecified: Secondary | ICD-10-CM | POA: Diagnosis present

## 2023-10-26 DIAGNOSIS — W19XXXA Unspecified fall, initial encounter: Secondary | ICD-10-CM | POA: Diagnosis present

## 2023-10-26 DIAGNOSIS — Z79899 Other long term (current) drug therapy: Secondary | ICD-10-CM | POA: Diagnosis not present

## 2023-10-26 DIAGNOSIS — I10 Essential (primary) hypertension: Secondary | ICD-10-CM | POA: Diagnosis present

## 2023-10-26 LAB — MAGNESIUM: Magnesium: 2.1 mg/dL (ref 1.7–2.4)

## 2023-10-26 LAB — URINE CULTURE: Culture: NO GROWTH

## 2023-10-26 LAB — RESPIRATORY PANEL BY PCR

## 2023-10-26 LAB — VITAMIN B12: Vitamin B-12: 694 pg/mL (ref 180–914)

## 2023-10-26 LAB — BASIC METABOLIC PANEL WITH GFR
Anion gap: 12 (ref 5–15)
BUN: 15 mg/dL (ref 8–23)
CO2: 23 mmol/L (ref 22–32)
Calcium: 8.1 mg/dL — ABNORMAL LOW (ref 8.9–10.3)
Chloride: 99 mmol/L (ref 98–111)
Creatinine, Ser: 0.9 mg/dL (ref 0.61–1.24)
GFR, Estimated: >60 mL/min (ref >60)
Glucose, Bld: 114 mg/dL — ABNORMAL HIGH (ref 70–99)
Potassium: 3.5 mmol/L (ref 3.5–5.1)
Sodium: 134 mmol/L — ABNORMAL LOW (ref 135–145)

## 2023-10-26 LAB — CBC
HCT: 39.4 % (ref 39.0–52.0)
Hemoglobin: 12.8 g/dL — ABNORMAL LOW (ref 13.0–17.0)
MCH: 30.6 pg (ref 26.0–34.0)
MCHC: 32.5 g/dL (ref 30.0–36.0)
MCV: 94.3 fL (ref 80.0–100.0)
Platelets: 202 K/uL (ref 150–400)
RBC: 4.18 MIL/uL — ABNORMAL LOW (ref 4.22–5.81)
RDW: 13.2 % (ref 11.5–15.5)
WBC: 10.7 K/uL — ABNORMAL HIGH (ref 4.0–10.5)
nRBC: 0 % (ref 0.0–0.2)

## 2023-10-26 LAB — HEMOGLOBIN A1C
Hgb A1c MFr Bld: 5.7 % — ABNORMAL HIGH (ref 4.8–5.6)
Mean Plasma Glucose: 116.89 mg/dL

## 2023-10-26 LAB — FOLATE: Folate: 15.8 ng/mL (ref >5.9)

## 2023-10-26 LAB — PROCALCITONIN: Procalcitonin: <0.1 ng/mL

## 2023-10-26 LAB — STREP PNEUMONIAE URINARY ANTIGEN: Strep Pneumo Urinary Antigen: NEGATIVE

## 2023-10-26 LAB — TSH: TSH: 1.478 u[IU]/mL (ref 0.350–4.500)

## 2023-10-26 NOTE — Plan of Care (Signed)

## 2023-10-26 NOTE — TOC Initial Note (Addendum)
 Transition of Care [facility]) - Initial/Assessment Note    Patient Details  Name: Luis Fletcher MRN: [id number] Date of Birth: 11-04-1929  Transition of Care [facility]) CM/SW Contact:    Lucie Lunger, LCSWA Phone Number: 10/26/2023, 12:33 PM  Clinical Narrative:                 CSW notes PT recommending SNF for pt. CSW met with pt at bedside to complete assessment. Pt lives alone and states he is independent with ADLs and drives himself when needed. Pt states that he has not had HH. Pt has a cane and walker in the home to use if and when needed. CSW inquired about pts interest in SNF, pt states he feels this is not needed and is comfortable returning home once medically stable. Pt states that he has family and friends that will be coming into the house to check on him. CSW inquired about pts interest in [facility] being arranged. Pt states that he does not feel this is needed at this time. TOC to follow.   Expected Discharge Plan: Home/Self Care Barriers to Discharge: Continued Medical Work up   Patient Goals and CMS Choice Patient states their goals for this hospitalization and ongoing recovery are:: return home CMS Medicare.gov Compare Post Acute Care list provided to:: Patient Choice offered to / list presented to : Patient [place] ownership interest in [facility].provided to:: Patient    Expected Discharge Plan and Services In-house Referral: Clinical Social Work Discharge Planning Services: CM Consult   Living arrangements for the past 2 months: Single Family Home                                      Prior Living Arrangements/Services Living arrangements for the past 2 months: Single Family Home Lives with:: Self Patient language and need for interpreter reviewed:: Yes Do you feel safe going back to the place where you live?: Yes      Need for Family Participation in Patient Care: Yes (Comment) Care giver support system in place?: Yes (comment) Current home  services: DME Criminal Activity/Legal Involvement Pertinent to Current Situation/Hospitalization: No - Comment as needed  Activities of Daily Living   ADL Screening (condition at time of admission) Independently performs ADLs?: Yes (appropriate for developmental age) Is the patient deaf or have difficulty hearing?: No Does the patient have difficulty seeing, even when wearing glasses/contacts?: No Does the patient have difficulty concentrating, remembering, or making decisions?: No  Permission Sought/Granted                  Emotional Assessment Appearance:: Appears stated age Attitude/Demeanor/Rapport: Engaged Affect (typically observed): Accepting Orientation: : Oriented to Self, Oriented to Place, Oriented to  Time, Oriented to Situation Alcohol / Substance Use: Not Applicable Psych Involvement: No (comment)  Admission diagnosis:  Fever in adult [R50.9] Laceration of scalp, initial encounter [S01.01XA] Sepsis due to undetermined organism [facility]) [A41.9] Patient Active Problem List   Diagnosis Date Noted   Sepsis due to undetermined organism (HCC) 10/25/2023   Lobar pneumonia (HCC) 10/25/2023   Syncope and collapse 03/26/2018   S/P CABG x 3 03/26/2018   PVD (peripheral vascular disease) (HCC) 03/26/2018   PTSD (post-traumatic stress disorder) 03/26/2018   Hypothyroidism 03/26/2018   Hypertension, essential 03/26/2018   Hyperlipidemia 03/26/2018   Coronary artery disease involving native heart 03/26/2018   Chronic low back pain 03/26/2018  PCP:  Aimee Cons, MD Pharmacy:   [facility] [address] [place] - [address] [place] [address] Phone: [phone] Fax: [phone]  [facility] - [place], [place] - [address] [facility] [address] [place] [address] Phone: [phone] Fax: [phone]     Social Drivers of Health (SDOH) Social History: SDOH Screenings   Food Insecurity: No Food Insecurity (10/25/2023)  Housing: Low  Risk  (10/25/2023)  Transportation Needs: No Transportation Needs (10/25/2023)  Utilities: Not At Risk (10/25/2023)  Social Connections: Moderately Isolated (10/25/2023)  Tobacco Use: Low Risk  (10/26/2023)   SDOH Interventions:     Readmission Risk Interventions     No data to display

## 2023-10-26 NOTE — Evaluation (Signed)
 Occupational Therapy Evaluation Patient Details Name: Luis Fletcher MRN: [id number] DOB: [birth year] Today's Date: 10/26/2023   History of Present Illness   Luis Fletcher is a 89 year old male with history of coronary disease status post CABG 1998, hypertension, hyperlipidemia, anxiety/depression presenting with 3-day history of generalized weakness and poor p.o. intake.  He states that he began developing some left lower rib pain after changing some tires on his car on 10/22/2023.  He denies any frank shortness of breath or coughing or hemoptysis.  He denies any fevers, chills, headache, chest pain, nausea, vomiting, diarrhea, abdominal pain, dysuria, hematuria.  Has had some loose stools over the past 3 days.  He quit smoking in 1961.  In the early morning 10/25/2023, the patient was feeling unsteady and weak going to the bathroom.  He fell, back to the bedroom.  He subsequently laid on the bed until the morning.  He eventually was strong enough to get up and unlock the door for his friend who came to visit him and check up on him.  EMS was activated when it was noticed the patient had a laceration on his scalp.  The patient denied loss of consciousness.  He denies any new medications.  In the ED, the patient was febrile up to 101.0 F.  He was hemodynamically stable with oxygen saturation 94-97% room air.  WBC 12.6, hemoglobin 13.2, platelets 223.  Sodium 134, potassium 4.1, bicarbonate 26, serum creatinine 0.84.  LFTs were unremarkable.  Lipase 25.  Lactic acid 1.4.  Troponin 11>> 11.  CT chest abdomen and pelvis showed lateral left upper lobe and lingular consolidations.  There was cholelithiasis without any ductal dilatation.  There was diverticulosis.  CT of the brain was negative for any acute intracranial findings.  CT of the cervical spine was negative for fracture or listhesis.  The patient was started on ceftriaxone .  He was admitted for further evaluation and treatment. (per MD)     Clinical  Impressions Pt agreeable to OT and PT co-evaluation. Pt reports 2 falls in the past 6 months. Pt does not typically use AD at baseline and is independent. Today pt was at a level of CGA for most standing and ambulating tasks due to instability and labored movement. One near loss of balance noted while ambulating. Pt able to complete peri-care with supervision and can complete most lower body and upper body ADL's with set up assist at this time. B UE generally weak today. Pt left in the chair with chair alarm set and call bell within reach. Pt will benefit from continued OT in the hospital and recommended venue below to increase strength, balance, and endurance for safe ADL's.        If plan is discharge home, recommend the following:   A little help with walking and/or transfers;A little help with bathing/dressing/bathroom;Assistance with cooking/housework;Help with stairs or ramp for entrance;Assist for transportation     Functional Status Assessment   Patient has had a recent decline in their functional status and demonstrates the ability to make significant improvements in function in a reasonable and predictable amount of time.     Equipment Recommendations   None recommended by OT             Precautions/Restrictions   Precautions Precautions: Fall Recall of Precautions/Restrictions: Intact Restrictions Weight Bearing Restrictions Per Provider Order: No     Mobility Bed Mobility Overal bed mobility: Needs Assistance Bed Mobility: Supine to Sit     Supine to sit: Contact guard  General bed mobility comments: labored movement; grasping sheets to help pull up in bed.    Transfers Overall transfer level: Needs assistance Equipment used:  (gait belt) Transfers: Sit to/from Stand, Bed to chair/wheelchair/BSC Sit to Stand: Contact guard assist     Step pivot transfers: Contact guard assist     General transfer comment: slow and labored movement;  unsteady      Balance Overall balance assessment: Needs assistance Sitting-balance support: No upper extremity supported, Feet supported Sitting balance-Leahy Scale: Fair Sitting balance - Comments: fair to good at EOB   Standing balance support: During functional activity, No upper extremity supported Standing balance-Leahy Scale: Fair Standing balance comment: without AD                           ADL either performed or assessed with clinical judgement   ADL Overall ADL's : Needs assistance/impaired     Grooming: Set up;Sitting   Upper Body Bathing: Set up;Sitting   Lower Body Bathing: Contact guard assist;Sitting/lateral leans   Upper Body Dressing : Set up;Sitting   Lower Body Dressing: Contact guard assist;Sitting/lateral leans   Toilet Transfer: Contact guard assist;Ambulation Toilet Transfer Details (indicate cue type and reason): chair to toilet without AD Toileting- Clothing Manipulation and Hygiene: Supervision/safety;Sitting/lateral lean       Functional mobility during ADLs: Contact guard assist (Able to ambulate over 50 feet in the hall without AD; mild loss of balance noted once.)       Vision Baseline Vision/History: 1 Wears glasses Ability to See in Adequate Light: 1 Impaired Patient Visual Report: No change from baseline Vision Assessment?: No apparent visual deficits     Perception Perception: Not tested       Praxis Praxis: Not tested       Pertinent Vitals/Pain Pain Assessment Pain Assessment: No/denies pain     Extremity/Trunk Assessment Upper Extremity Assessment Upper Extremity Assessment: Generalized weakness   Lower Extremity Assessment Lower Extremity Assessment: Defer to PT evaluation   Cervical / Trunk Assessment Cervical / Trunk Assessment: Kyphotic   Communication Communication Communication: Impaired Factors Affecting Communication: Hearing impaired   Cognition Arousal: Alert Behavior During Therapy: WFL  for tasks assessed/performed Cognition: No apparent impairments                               Following commands: Intact       Cueing  General Comments   Cueing Techniques: Verbal cues;Tactile cues;Visual cues                 Home Living Family/patient expects to be discharged to:: Private residence Living Arrangements: Alone Available Help at Discharge: Family;Available PRN/intermittently (Pt reports possbility for 24/7 assist. Reports he may go stay with his daughter.) Type of Home: House Home Access: Stairs to enter [employer] of Steps: 7 Entrance Stairs-Rails: Left;Right;Can reach both Home Layout: One level     Bathroom Shower/Tub: Producer, television/film/video: Standard Bathroom Accessibility: No (Can take RW up to the toilet)   Home Equipment: Rolling Walker (2 wheels);Cane - single point;Grab bars - tub/shower          Prior Functioning/Environment Prior Level of Function : Independent/Modified Independent;History of Falls (last six months) (2 falls in 6 months)             Mobility Comments: Community ambulator without AD ADLs Comments: Independent;drives    OT Problem  List: Decreased activity tolerance;Decreased strength   OT Treatment/Interventions: Self-care/ADL training;Therapeutic exercise;Therapeutic activities;Patient/family education;DME and/or AE instruction;Balance training      OT Goals(Current goals can be found in the care plan section)   Acute Rehab OT Goals Patient Stated Goal: return home OT Goal Formulation: With patient Time For Goal Achievement: 11/09/23 Potential to Achieve Goals: Good   OT Frequency:  Min 2X/week    Co-evaluation PT/OT/SLP Co-Evaluation/Treatment: Yes Reason for Co-Treatment: To address functional/ADL transfers   OT goals addressed during session: ADL's and self-care      AM-PAC OT 6 Clicks Daily Activity     Outcome Measure Help from another person eating meals?:  None Help from another person taking care of personal grooming?: A Little Help from another person toileting, which includes using toliet, bedpan, or urinal?: A Little Help from another person bathing (including washing, rinsing, drying)?: A Little Help from another person to put on and taking off regular upper body clothing?: A Little Help from another person to put on and taking off regular lower body clothing?: A Little 6 Click Score: 19   End of Session Equipment Utilized During Treatment: Gait belt  Activity Tolerance: Patient tolerated treatment well Patient left: in chair;with call bell/phone within reach;with chair alarm set  OT Visit Diagnosis: Unsteadiness on feet (R26.81);Other abnormalities of gait and mobility (R26.89);Muscle weakness (generalized) (M62.81);History of falling (Z91.81)                Time: 9171-9144 OT Time Calculation (min): 27 min Charges:  OT General Charges $OT Visit: 1 Visit OT Evaluation $OT Eval Low Complexity: 1 Low  Hillery Bhalla OT, MOT   Jayson Person 10/26/2023, 10:43 AM

## 2023-10-26 NOTE — Evaluation (Signed)
 Physical Therapy Evaluation Patient Details Name: Rishawn Walck MRN: [id number] DOB: May 28, 1929 Today's Date: 10/26/2023  History of Present Illness  Afshin Chrystal is a 89 year old male with history of coronary disease status post CABG 1998, hypertension, hyperlipidemia, anxiety/depression presenting with 3-day history of generalized weakness and poor p.o. intake.  He states that he began developing some left lower rib pain after changing some tires on his car on 10/22/2023.  He denies any frank shortness of breath or coughing or hemoptysis.  He denies any fevers, chills, headache, chest pain, nausea, vomiting, diarrhea, abdominal pain, dysuria, hematuria.  Has had some loose stools over the past 3 days.  He quit smoking in 1961.  In the early morning 10/25/2023, the patient was feeling unsteady and weak going to the bathroom.  He fell, back to the bedroom.  He subsequently laid on the bed until the morning.  He eventually was strong enough to get up and unlock the door for his friend who came to visit him and check up on him.  EMS was activated when it was noticed the patient had a laceration on his scalp.  The patient denied loss of consciousness.  He denies any new medications.  In the ED, the patient was febrile up to 101.0 F.  He was hemodynamically stable with oxygen saturation 94-97% room air.  WBC 12.6, hemoglobin 13.2, platelets 223.  Sodium 134, potassium 4.1, bicarbonate 26, serum creatinine 0.84.  LFTs were unremarkable.  Lipase 25.  Lactic acid 1.4.  Troponin 11>> 11.  CT chest abdomen and pelvis showed lateral left upper lobe and lingular consolidations.  There was cholelithiasis without any ductal dilatation.  There was diverticulosis.  CT of the brain was negative for any acute intracranial findings.  CT of the cervical spine was negative for fracture or listhesis.  The patient was started on ceftriaxone .  He was admitted for further evaluation and treatment.   Clinical Impression  Patient  agreeable to OT/PT co-evaluation. Patient reports at baseline he is independent with mobility, ADLs, and iADLs. On this date, patient is CGA with all bed mobility, functional tranfers, and ambulation without an AD. Patient requires increased time with STS from bed, recliner, and toilet due to general weakness. Once on feet, patient is unsteady, indicating increased fall risk. Patient reports he now lives alone and has had at least 2 falls in home in last 6 months. Patient states he may have some assist once home but unsure. With that and recent falls, patient will benefit from continued skilled physical therapy acutely and in recommended venue in order to address current deficits prior to returning home for his safety. At end of session, patient mentions daughter is coming from [facility] and may take him home with her. Would recommend HHPT services should pt have supervision once home. Patient tolerated sitting in recliner at end of session with alarm set and nursing staff present.        If plan is discharge home, recommend the following: A little help with walking and/or transfers;A little help with bathing/dressing/bathroom;Help with stairs or ramp for entrance;Assist for transportation   Can travel by private vehicle        Equipment Recommendations None recommended by PT  Recommendations for Other Services       Functional Status Assessment Patient has had a recent decline in their functional status and demonstrates the ability to make significant improvements in function in a reasonable and predictable amount of time.     Precautions / Restrictions Precautions  Precautions: Fall Recall of Precautions/Restrictions: Intact Restrictions Weight Bearing Restrictions Per Provider Order: No      Mobility  Bed Mobility Overal bed mobility: Needs Assistance Bed Mobility: Supine to Sit     Supine to sit: Contact guard     General bed mobility comments: HOB flat, Slow labored movement, pt  pulls at sheets to elevate trunk indicating dec core strength    Transfers Overall transfer level: Needs assistance Equipment used: None Transfers: Sit to/from Stand, Bed to chair/wheelchair/BSC Sit to Stand: Contact guard assist   Step pivot transfers: Contact guard assist       General transfer comment: Slow labored movement throuhgout, pt demo inc trunk flexion during transfer and uses arm rest of chair for support throughout, unsteady once on feet    Ambulation/Gait Ambulation/Gait assistance: Contact guard assist Gait Distance (Feet): 75 Feet Assistive device: None Gait Pattern/deviations: Step-through pattern, Decreased step length - right, Decreased step length - left, Trunk flexed, Drifts right/left, Knee flexed in stance - right, Knee flexed in stance - left, Decreased dorsiflexion - right, Decreased dorsiflexion - left, Shuffle, Narrow base of support Gait velocity: Dec     General Gait Details: Pt demo slow labored movement while ambulating with the above deviations. Pt unsteady on feet, a couple instances of LOB throughout warranting CGA  Stairs            Wheelchair Mobility     Tilt Bed    Modified Rankin (Stroke Patients Only)       Balance Overall balance assessment: Needs assistance Sitting-balance support: No upper extremity supported, Feet supported Sitting balance-Leahy Scale: Fair Sitting balance - Comments: fair to good at EOB   Standing balance support: During functional activity, No upper extremity supported Standing balance-Leahy Scale: Fair Standing balance comment: without AD           Pertinent Vitals/Pain Pain Assessment Pain Assessment: No/denies pain    Home Living Family/patient expects to be discharged to:: Private residence Living Arrangements: Alone Available Help at Discharge: Family;Available PRN/intermittently Type of Home: House Home Access: Stairs to enter Entrance Stairs-Rails: Left;Right;Can reach  both Entrance Stairs-Number of Steps: 7   Home Layout: One level Home Equipment: Agricultural consultant (2 wheels);Cane - single point;Grab bars - tub/shower Additional Comments: At end of session, pt reports daughter is coming from [facility] and may take him to stay with her for a little while he recovers. Above info is for pt's home    Prior Function Prior Level of Function : Independent/Modified Independent;History of Falls (last six months)             Mobility Comments: Community ambulator without AD. reports 2 falls in last 6 months ADLs Comments: Independent;drives     Extremity/Trunk Assessment   Upper Extremity Assessment Upper Extremity Assessment: Defer to OT evaluation    Lower Extremity Assessment Lower Extremity Assessment: Generalized weakness (MMT: Ankle DF 4+/5 bilat, hip flexion 4-/5 bilat, knee ext 4-/5 bilat)    Cervical / Trunk Assessment Cervical / Trunk Assessment: Kyphotic  Communication   Communication Communication: Impaired Factors Affecting Communication: Hearing impaired;Other (comment) (has hearing aids, may not be charged/need new battery)    Cognition Arousal: Alert Behavior During Therapy: WFL for tasks assessed/performed   PT - Cognitive impairments: No apparent impairments     Following commands: Intact       Cueing Cueing Techniques: Verbal cues, Tactile cues, Visual cues     General Comments      Exercises  Assessment/Plan    PT Assessment Patient needs continued PT services;All further PT needs can be met in the next venue of care  PT Problem List Decreased strength;Decreased range of motion;Decreased activity tolerance;Decreased balance;Decreased mobility       PT Treatment Interventions DME instruction;Gait training;Stair training;Functional mobility training;Therapeutic activities;Therapeutic exercise;Balance training;Patient/family education    PT Goals (Current goals can be found in the Care Plan section)  Acute Rehab PT  Goals Patient Stated Goal: Return home following rehab stay or stay with daughter PT Goal Formulation: With patient Time For Goal Achievement: 11/09/23 Potential to Achieve Goals: Good    Frequency Min 3X/week     Co-evaluation PT/OT/SLP Co-Evaluation/Treatment: Yes Reason for Co-Treatment: To address functional/ADL transfers PT goals addressed during session: Mobility/safety with mobility OT goals addressed during session: ADL's and self-care       AM-PAC PT 6 Clicks Mobility  Outcome Measure Help needed turning from your back to your side while in a flat bed without using bedrails?: None Help needed moving from lying on your back to sitting on the side of a flat bed without using bedrails?: A Little Help needed moving to and from a bed to a chair (including a wheelchair)?: A Little Help needed standing up from a chair using your arms (e.g., wheelchair or bedside chair)?: A Little Help needed to walk in hospital room?: A Little Help needed climbing 3-5 steps with a railing? : A Lot 6 Click Score: 18    End of Session Equipment Utilized During Treatment: Gait belt Activity Tolerance: Patient tolerated treatment well Patient left: in chair;with call bell/phone within reach;with chair alarm set Nurse Communication: Mobility status PT Visit Diagnosis: Unsteadiness on feet (R26.81);Other abnormalities of gait and mobility (R26.89);History of falling (Z91.81);Muscle weakness (generalized) (M62.81);Difficulty in walking, not elsewhere classified (R26.2)    Time: 9171-9144 PT Time Calculation (min) (ACUTE ONLY): 27 min   Charges:   PT Evaluation $PT Eval Moderate Complexity: 1 Mod   PT General Charges $$ ACUTE PT VISIT: 1 Visit         11:59 AM, 10/26/23 Rosaria Settler, PT, DPT [place] with [facility]

## 2023-10-26 NOTE — Plan of Care (Signed)
  Problem: Acute Rehab PT Goals(only PT should resolve) Goal: Pt Will Go Supine/Side To Sit Outcome: Progressing Flowsheets (Taken 10/26/2023 1205) Pt will go Supine/Side to Sit: Independently Goal: Patient Will Transfer Sit To/From Stand Outcome: Progressing Flowsheets (Taken 10/26/2023 1205) Patient will transfer sit to/from stand: with supervision Goal: Pt Will Transfer Bed To Chair/Chair To Bed Outcome: Progressing Flowsheets (Taken 10/26/2023 1205) Pt will Transfer Bed to Chair/Chair to Bed: with supervision Goal: Pt Will Ambulate Outcome: Progressing Flowsheets (Taken 10/26/2023 1205) Pt will Ambulate:  100 feet  with supervision  with least restrictive assistive device Goal: Pt Will Go Up/Down Stairs Outcome: Progressing Flowsheets (Taken 10/26/2023 1205) Pt will Go Up / Down Stairs:  6-9 stairs  with supervision  with rail(s)   12:06 PM, 10/26/23 Rosaria Settler, PT, DPT [place] with [facility]

## 2023-10-26 NOTE — TOC CM/SW Note (Signed)
 Transition of Care [facility]) - Inpatient Brief Assessment   Patient Details  Name: Luis Fletcher MRN: [id number] Date of Birth: [birth year]  Transition of Care [facility]) CM/SW Contact:    Lucie Lunger, LCSWA Phone Number: 10/26/2023, 10:23 AM   Clinical Narrative: [facility] notified of pts hospital admission. [facility] notification ID is [id number].   Transition of Care Department [facility]) has reviewed patient and no TOC needs have been identified at this time. We will continue to monitor patient advancement through interdiciplinary progression rounds. If new patient transition needs arise, please place a TOC consult.  Transition of Care Asessment: Insurance and Status: Insurance coverage has been reviewed Patient has primary care physician: Yes Home environment has been reviewed: From home Prior level of function:: Independent Prior/Current Home Services: No current home services Social Drivers of Health Review: SDOH reviewed no interventions necessary Readmission risk has been reviewed: Yes Transition of care needs: no transition of care needs at this time

## 2023-10-26 NOTE — Plan of Care (Signed)
  Problem: Acute Rehab OT Goals (only OT should resolve) Goal: Pt. Will Perform Grooming Flowsheets (Taken 10/26/2023 1046) Pt Will Perform Grooming:  with modified independence  standing Goal: Pt. Will Perform Lower Body Dressing Flowsheets (Taken 10/26/2023 1046) Pt Will Perform Lower Body Dressing: with modified independence Goal: Pt. Will Transfer To Toilet Flowsheets (Taken 10/26/2023 1046) Pt Will Transfer to Toilet:  with modified independence  ambulating Goal: Pt. Will Perform Toileting-Clothing Manipulation Flowsheets (Taken 10/26/2023 1046) Pt Will Perform Toileting - Clothing Manipulation and hygiene: with modified independence Goal: Pt/Caregiver Will Perform Home Exercise Program Flowsheets (Taken 10/26/2023 1046) Pt/caregiver will Perform Home Exercise Program:  Increased strength  Both right and left upper extremity  Independently  Johnathyn Viscomi OT, MOT

## 2023-10-26 NOTE — Progress Notes (Addendum)
 PROGRESS NOTE  Luis Fletcher FMW:[id number] DOB: 05-16-1929 DOA: 10/25/2023 PCP: Aimee Cons, MD  Brief History:  89 year old male with history of coronary disease status post CABG 1998, hypertension, hyperlipidemia, anxiety/depression presenting with 3-day history of generalized weakness and poor p.o. intake.  He states that he began developing some left lower rib pain after changing some tires on his car on 10/22/2023.  He denies any frank shortness of breath or coughing or hemoptysis.  He denies any fevers, chills, headache, chest pain, nausea, vomiting, diarrhea, abdominal pain, dysuria, hematuria.  Has had some loose stools over the past 3 days.  He quit smoking in 1961.  In the early morning 10/25/2023, the patient was feeling unsteady and weak going to the bathroom.  He fell, back to the bedroom.  He subsequently laid on the bed until the morning.  He eventually was strong enough to get up and unlock the door for his friend who came to visit him and check up on him.  EMS was activated when it was noticed the patient had a laceration on his scalp.  The patient denied loss of consciousness.  He denies any new medications. In the ED, the patient was febrile up to 101.0 F.  He was hemodynamically stable with oxygen saturation 94-97% room air.  WBC 12.6, hemoglobin 13.2, platelets 223.  Sodium 134, potassium 4.1, bicarbonate 26, serum creatinine 0.84.  LFTs were unremarkable.  Lipase 25.  Lactic acid 1.4.  Troponin 11>> 11.  CT chest abdomen and pelvis showed lateral left upper lobe and lingular consolidations.  There was cholelithiasis without any ductal dilatation.  There was diverticulosis.  CT of the brain was negative for any acute intracranial findings.  CT of the cervical spine was negative for fracture or listhesis.  The patient was started on ceftriaxone .  He was admitted for further evaluation and treatment.   Assessment/Plan:  Sepsis  - Presented with fever and leukocytosis -  Secondary to pneumonia - Lactic acid 1.4 - blood cultures remain negative - Continue ceftriaxone  - Add azithromycin  - Follow blood cultures - UA 6-10 WBC - clinically improving--sepsis physiology improved - viral resp panel neg   Lobar pneumonia - 10/25/2023 CT chest as discussed above - Urine Legionella urine - Check PCT < 0.10 - Urine Streptococcus pneumonia antigen -Continue ceftriaxone  and azithromycin    Coronary disease -No chest pain presently - Troponin 11>>11   Mixed hyperlipidemia - Continue statin   Hypothyroidism - Continue Synthroid    Anxiety/depression - Continue BuSpar  and Paxil   Generalized weakness - PT>>SNF - B12--694 - folate 15.8 -TSH  1478           Family Communication:  daughter updated 8/21  Consultants:  none  Code Status:  FULL  DVT Prophylaxis:  [place] Lovenox    Procedures: As Listed in Progress Note Above  Antibiotics: Ceftriaxone  8/20>> Azithro 8/20>>      Subjective: Pt is feeling a little better.  He is feeling a little stronger.  Denies f/c, cp, sob, n/v/d, abd pain  Objective: Vitals:   10/25/23 2130 10/25/23 2133 10/26/23 0101 10/26/23 0500  BP: (!) 130/93 (!) 162/65 (!) 143/62 (!) 130/93  Pulse: 82 92 85 82  Resp: (!) 24 20 (!) 24 (!) 25  Temp: 98 F (36.7 C) 98.7 F (37.1 C) 98.4 F (36.9 C) 98 F (36.7 C)  TempSrc: Oral Oral Oral   SpO2:  100% 93% 95%  Weight: 69.5 kg  Height: 5' 7 (1.702 m)       Intake/Output Summary (Last 24 hours) at 10/26/2023 1157 Last data filed at 10/26/2023 0830 Gross per 24 hour  Intake 240 ml  Output 150 ml  Net 90 ml   Weight change:  Exam:  General:  Pt is alert, follows commands appropriately, not in acute distress HEENT: No icterus, No thrush, No neck mass, [place]/AT Cardiovascular: RRR, S1/S2, no rubs, no gallops Respiratory: basilar rales L>R Abdomen: Soft/+BS, non tender, non distended, no guarding Extremities: No edema, No lymphangitis, No petechiae, No  rashes, no synovitis   Data Reviewed: I have personally reviewed following labs and imaging studies Basic Metabolic Panel: Recent Labs  Lab 10/25/23 1052 10/26/23 0430  NA 134* 134*  K 4.1 3.5  CL 96* 99  CO2 26 23  GLUCOSE 136* 114*  BUN 13 15  CREATININE 0.84 0.90  CALCIUM 8.5* 8.1*  MG  --  2.1   Liver Function Tests: Recent Labs  Lab 10/25/23 1052  AST 19  ALT 15  ALKPHOS 20*  BILITOT 0.7  PROT 7.0  ALBUMIN 3.3*   Recent Labs  Lab 10/25/23 1052  LIPASE 25   No results for input(s): AMMONIA in the last 168 hours. Coagulation Profile: No results for input(s): INR, PROTIME in the last 168 hours. CBC: Recent Labs  Lab 10/25/23 1052 10/26/23 0430  WBC 12.6* 10.7*  NEUTROABS 10.6*  --   HGB 13.2 12.8*  HCT 40.8 39.4  MCV 92.1 94.3  PLT 223 202   Cardiac Enzymes: Recent Labs  Lab 10/25/23 1052  CKTOTAL 160   BNP: Invalid input(s): POCBNP CBG: Recent Labs  Lab 10/25/23 1134  GLUCAP 121*   HbA1C: Recent Labs    10/25/23 1216  HGBA1C 5.7*   Urine analysis:    Component Value Date/Time   COLORURINE YELLOW 10/25/2023 1153   APPEARANCEUR CLEAR 10/25/2023 1153   LABSPEC 1.016 10/25/2023 1153   PHURINE 6.0 10/25/2023 1153   GLUCOSEU NEGATIVE 10/25/2023 1153   HGBUR MODERATE (A) 10/25/2023 1153   BILIRUBINUR NEGATIVE 10/25/2023 1153   KETONESUR 5 (A) 10/25/2023 1153   PROTEINUR 30 (A) 10/25/2023 1153   NITRITE NEGATIVE 10/25/2023 1153   LEUKOCYTESUR NEGATIVE 10/25/2023 1153   Sepsis Labs: @LABRCNTIP (procalcitonin:4,lacticidven:4) ) Recent Results (from the past 240 hours)  Resp panel by RT-PCR (RSV, Flu A&B, Covid) Anterior Nasal Swab     Status: None   Collection Time: 10/25/23 10:29 AM   Specimen: Anterior Nasal Swab  Result Value Ref Range Status   SARS Coronavirus 2 by RT PCR NEGATIVE NEGATIVE Final    Comment: (NOTE) SARS-CoV-2 target nucleic acids are NOT DETECTED.  The SARS-CoV-2 RNA is generally detectable in upper  respiratory specimens during the acute phase of infection. The lowest concentration of SARS-CoV-2 viral copies this assay can detect is 138 copies/mL. A negative result does not preclude SARS-Cov-2 infection and should not be used as the sole basis for treatment or other patient management decisions. A negative result may occur with  improper specimen collection/handling, submission of specimen other than nasopharyngeal swab, presence of viral mutation(s) within the areas targeted by this assay, and inadequate number of viral copies(<138 copies/mL). A negative result must be combined with clinical observations, patient history, and epidemiological information. The expected result is Negative.  Fact Sheet for Patients:  BloggerCourse.com  Fact Sheet for Healthcare Providers:  SeriousBroker.it  This test is no t yet approved or cleared by the [place]  FDA and  has been  authorized for detection and/or diagnosis of SARS-CoV-2 by FDA under an Emergency Use Authorization (EUA). This EUA will remain  in effect (meaning this test can be used) for the duration of the COVID-19 declaration under Section 564(b)(1) of the Act, 21 U.S.C.section 360bbb-3(b)(1), unless the authorization is terminated  or revoked sooner.       Influenza A by PCR NEGATIVE NEGATIVE Final   Influenza B by PCR NEGATIVE NEGATIVE Final    Comment: (NOTE) The Xpert Xpress SARS-CoV-2/FLU/RSV plus assay is intended as an aid in the diagnosis of influenza from Nasopharyngeal swab specimens and should not be used as a sole basis for treatment. Nasal washings and aspirates are unacceptable for Xpert Xpress SARS-CoV-2/FLU/RSV testing.  Fact Sheet for Patients: BloggerCourse.com  Fact Sheet for Healthcare Providers: SeriousBroker.it  This test is not yet approved or cleared by the [place]  FDA and has been  authorized for detection and/or diagnosis of SARS-CoV-2 by FDA under an Emergency Use Authorization (EUA). This EUA will remain in effect (meaning this test can be used) for the duration of the COVID-19 declaration under Section 564(b)(1) of the Act, 21 U.S.C. section 360bbb-3(b)(1), unless the authorization is terminated or revoked.     Resp Syncytial Virus by PCR NEGATIVE NEGATIVE Final    Comment: (NOTE) Fact Sheet for Patients: BloggerCourse.com  Fact Sheet for Healthcare Providers: SeriousBroker.it  This test is not yet approved or cleared by the [place]  FDA and has been authorized for detection and/or diagnosis of SARS-CoV-2 by FDA under an Emergency Use Authorization (EUA). This EUA will remain in effect (meaning this test can be used) for the duration of the COVID-19 declaration under Section 564(b)(1) of the Act, 21 U.S.C. section 360bbb-3(b)(1), unless the authorization is terminated or revoked.  Performed at [facility], [address], [place], [place] [address]   Culture, blood (routine x 2)     Status: None (Preliminary result)   Collection Time: 10/25/23 12:16 PM   Specimen: BLOOD  Result Value Ref Range Status   Specimen Description BLOOD BLOOD RIGHT ARM  Final   Special Requests   Final    BOTTLES DRAWN AEROBIC AND ANAEROBIC Blood Culture adequate volume   Culture   Final    NO GROWTH < 24 HOURS Performed at [facility], [address] [place], [place] [address]    Report Status PENDING  Incomplete  Culture, blood (routine x 2)     Status: None (Preliminary result)   Collection Time: 10/25/23 12:21 PM   Specimen: BLOOD  Result Value Ref Range Status   Specimen Description BLOOD BLOOD RIGHT HAND  Final   Special Requests   Final    Blood Culture adequate volume BOTTLES DRAWN AEROBIC ONLY   Culture   Final    NO GROWTH < 24 HOURS Performed at [facility], [address], [place], [place] [address]     Report Status PENDING  Incomplete  Respiratory (~20 pathogens) panel by PCR     Status: None   Collection Time: 10/26/23  5:00 AM   Specimen: Nasopharyngeal Swab; Respiratory  Result Value Ref Range Status   Adenovirus NOT DETECTED NOT DETECTED Final   Coronavirus 229E NOT DETECTED NOT DETECTED Final    Comment: (NOTE) The Coronavirus on the Respiratory Panel, DOES NOT test for the novel  Coronavirus (2019 nCoV)    Coronavirus HKU1 NOT DETECTED NOT DETECTED Final   Coronavirus NL63 NOT DETECTED NOT DETECTED Final   Coronavirus OC43 NOT DETECTED NOT DETECTED Final  Metapneumovirus NOT DETECTED NOT DETECTED Final   Rhinovirus / Enterovirus NOT DETECTED NOT DETECTED Final   Influenza A NOT DETECTED NOT DETECTED Final   Influenza B NOT DETECTED NOT DETECTED Final   Parainfluenza Virus 1 NOT DETECTED NOT DETECTED Final   Parainfluenza Virus 2 NOT DETECTED NOT DETECTED Final   Parainfluenza Virus 3 NOT DETECTED NOT DETECTED Final   Parainfluenza Virus 4 NOT DETECTED NOT DETECTED Final   Respiratory Syncytial Virus NOT DETECTED NOT DETECTED Final   Bordetella pertussis NOT DETECTED NOT DETECTED Final   Bordetella Parapertussis NOT DETECTED NOT DETECTED Final   Chlamydophila pneumoniae NOT DETECTED NOT DETECTED Final   Mycoplasma pneumoniae NOT DETECTED NOT DETECTED Final    Comment: Performed at [facility], [address] [place], [place] [address]     Scheduled Meds:  aspirin  EC  81 mg Oral Daily   azithromycin   500 mg Oral Daily   busPIRone   5 mg Oral TID   enoxaparin  (LOVENOX ) injection  40 mg Subcutaneous Q24H   levothyroxine   100 mcg Oral Q0600   PARoxetine   30 mg Oral QHS   Continuous Infusions:  cefTRIAXone  (ROCEPHIN )  IV 1 g (10/26/23 1016)    Procedures/Studies: CT CHEST ABDOMEN PELVIS WO CONTRAST Result Date: 10/25/2023 CLINICAL DATA:  Abdominal/flank pain, stone suspected EXAM: CT CHEST, ABDOMEN AND PELVIS WITHOUT CONTRAST TECHNIQUE: Multidetector CT  imaging of the chest, abdomen and pelvis was performed following the standard protocol without IV contrast. RADIATION DOSE REDUCTION: This exam was performed according to the departmental dose-optimization program which includes automated exposure control, adjustment of the mA and/or kV according to patient size and/or use of iterative reconstruction technique. COMPARISON:  March 02, 2023 FINDINGS: Of note, the lack of intravenous contrast limits evaluation of the solid organ parenchyma and vascularity. CT CHEST FINDINGS Cardiovascular: No cardiomegaly or pericardial effusion.No aortic aneurysm. Diffuse aortic atherosclerosis with dense multi-vessel coronary atherosclerosis. Postsurgical changes of a prior CABG procedure. Mediastinum/Nodes: No mediastinal mass.No mediastinal, hilar, or axillary lymphadenopathy. Lungs/Pleura: The midline trachea and bronchi are patent. Biapical pleuroparenchymal scarring. Fibrolinear scarring within the lingula and both lower lobes. Focal airspace consolidation with air bronchograms dependently and laterally within the left upper lobe and lingula. No pleural effusion or pneumothorax. Scattered calcified granulomas. 2 mm anterior left upper lobe nodule (axial 53), likely infectious or inflammatory. CT ABDOMEN PELVIS FINDINGS Hepatobiliary: No mass.Multiple small radiopaque gallstones. No wall thickening. No intrahepatic or extrahepatic biliary ductal dilation. Pancreas: No mass or main ductal dilation. No peripancreatic inflammation or fluid collection. Spleen: Normal size. No mass. Adrenals/Urinary Tract: No adrenal masses. No renal mass. No hydronephrosis or nephrolithiasis. The urinary bladder is distended without focal abnormality. Stomach/Bowel: The stomach is decompressed without focal abnormality. No small bowel wall thickening or inflammation. No small bowel obstruction.Normal appendix. Descending and sigmoid colonic diverticulosis. No changes of acute diverticulitis.  Vascular/Lymphatic: No aortic aneurysm. Diffuse aortoiliac atherosclerosis. No intraabdominal or pelvic lymphadenopathy. Reproductive: Borderline prostatomegaly.  No free pelvic fluid. Other: No pneumoperitoneum, ascites, or mesenteric inflammation. Musculoskeletal: No acute fracture or destructive lesion. Multilevel degenerative disc disease of the spine. Sternotomy wires. Osteopenia. 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 Postsurgical changes along the right inguinal region possibly due to interval hernia repair. No associated fluid collection. IMPRESSION: 1. Lateral left upper lobe and lingular airspace consolidation, favored to represent left upper lobe pneumonia. No parapneumonic effusion. 2. Cholecystolithiasis.  No changes of acute cholecystitis. 3. Descending and sigmoid colonic diverticulosis. No changes of acute diverticulitis. 4. Borderline prostatomegaly. Aortic Atherosclerosis (  ICD10-I70.0). Electronically Signed   By: Rogelia Myers M.D.   On: 10/25/2023 14:05   CT Head Wo Contrast Result Date: 10/25/2023 CLINICAL DATA:  Provided history: Head trauma, minor. Neck trauma. Additional history provided: Fall (with head trauma). Scalp laceration. EXAM: CT HEAD WITHOUT CONTRAST CT CERVICAL SPINE WITHOUT CONTRAST TECHNIQUE: Multidetector CT imaging of the head and cervical spine was performed following the standard protocol without intravenous contrast. Multiplanar CT image reconstructions of the cervical spine were also generated. RADIATION DOSE REDUCTION: This exam was performed according to the departmental dose-optimization program which includes automated exposure control, adjustment of the mA and/or kV according to patient size and/or use of iterative reconstruction technique. COMPARISON:  None. FINDINGS: CT HEAD FINDINGS Brain: Mild generalized cerebral atrophy. There is no acute intracranial hemorrhage. No demarcated cortical infarct. No extra-axial fluid collection. No evidence of an  intracranial mass. No midline shift. Vascular: No hyperdense vessel.  Atherosclerotic calcifications. Skull: No calvarial fracture or aggressive osseous lesion. Sinuses/Orbits: No mass or acute finding within the imaged orbits. No significant paranasal sinus disease at the imaged levels. Other: Left frontoparietal scalp laceration. CT CERVICAL SPINE FINDINGS Alignment: Slight grade 1 anterolisthesis at C2-C3, C3-C4 and C4-C5. 4 mm C5-C6 grade 1 anterolisthesis. 3 mm C6-C7 grade 1 anterolisthesis. Slight C7-T1 grade 1 anterolisthesis. Skull base and vertebrae: The basion-dental and atlanto-dental intervals are maintained.No evidence of acute fracture to the cervical spine. Soft tissues and spinal canal: No prevertebral fluid or swelling. No visible canal hematoma. Disc levels: Cervical spondylosis with multilevel disc space narrowing, disc bulges/central disc protrusions, uncovertebral hypertrophy and facet arthropathy. No appreciable high-grade spinal canal stenosis. Multilevel bony neural foraminal narrowing. Multilevel ventral osteophytes (including a fairly bulky bridging ventral osteophyte at C6-C7). Upper chest: No consolidation within the imaged lung apices. No visible pneumothorax. IMPRESSION: CT head: 1.  No evidence of an acute intracranial abnormality. 2. Left frontoparietal scalp laceration. 3. Mild generalized cerebral atrophy. CT cervical spine: 1. No evidence of an acute cervical spine fracture. 2. Grade 1 anterolisthesis at C2-C3, C3-C4, C4-C5, C5-C6, C6-C7 and C7-T1. 3. Cervical spondylosis as described. Electronically Signed   By: Rockey Childs D.O.   On: 10/25/2023 11:26   CT Cervical Spine Wo Contrast Result Date: 10/25/2023 CLINICAL DATA:  Provided history: Head trauma, minor. Neck trauma. Additional history provided: Fall (with head trauma). Scalp laceration. EXAM: CT HEAD WITHOUT CONTRAST CT CERVICAL SPINE WITHOUT CONTRAST TECHNIQUE: Multidetector CT imaging of the head and cervical spine was  performed following the standard protocol without intravenous contrast. Multiplanar CT image reconstructions of the cervical spine were also generated. RADIATION DOSE REDUCTION: This exam was performed according to the departmental dose-optimization program which includes automated exposure control, adjustment of the mA and/or kV according to patient size and/or use of iterative reconstruction technique. COMPARISON:  None. FINDINGS: CT HEAD FINDINGS Brain: Mild generalized cerebral atrophy. There is no acute intracranial hemorrhage. No demarcated cortical infarct. No extra-axial fluid collection. No evidence of an intracranial mass. No midline shift. Vascular: No hyperdense vessel.  Atherosclerotic calcifications. Skull: No calvarial fracture or aggressive osseous lesion. Sinuses/Orbits: No mass or acute finding within the imaged orbits. No significant paranasal sinus disease at the imaged levels. Other: Left frontoparietal scalp laceration. CT CERVICAL SPINE FINDINGS Alignment: Slight grade 1 anterolisthesis at C2-C3, C3-C4 and C4-C5. 4 mm C5-C6 grade 1 anterolisthesis. 3 mm C6-C7 grade 1 anterolisthesis. Slight C7-T1 grade 1 anterolisthesis. Skull base and vertebrae: The basion-dental and atlanto-dental intervals are maintained.No evidence of acute fracture to the  cervical spine. Soft tissues and spinal canal: No prevertebral fluid or swelling. No visible canal hematoma. Disc levels: Cervical spondylosis with multilevel disc space narrowing, disc bulges/central disc protrusions, uncovertebral hypertrophy and facet arthropathy. No appreciable high-grade spinal canal stenosis. Multilevel bony neural foraminal narrowing. Multilevel ventral osteophytes (including a fairly bulky bridging ventral osteophyte at C6-C7). Upper chest: No consolidation within the imaged lung apices. No visible pneumothorax. IMPRESSION: CT head: 1.  No evidence of an acute intracranial abnormality. 2. Left frontoparietal scalp laceration. 3.  Mild generalized cerebral atrophy. CT cervical spine: 1. No evidence of an acute cervical spine fracture. 2. Grade 1 anterolisthesis at C2-C3, C3-C4, C4-C5, C5-C6, C6-C7 and C7-T1. 3. Cervical spondylosis as described. Electronically Signed   By: Rockey Childs D.O.   On: 10/25/2023 11:26   DG Chest 2 View Result Date: 10/25/2023 CLINICAL DATA:  Shortness of breath. EXAM: CHEST - 2 VIEW COMPARISON:  None Available. FINDINGS: The heart size and mediastinal contours are within normal limits. Right lung is clear. Mild left basilar scarring is noted. Status post coronary artery bypass graft. No acute osseous abnormality is noted. IMPRESSION: No active cardiopulmonary disease. Electronically Signed   By: Lynwood Landy Raddle M.D.   On: 10/25/2023 11:10    Alm Schneider, DO  [facility]  If 7PM-7AM, please contact night-coverage [email or web address] Password [facility] 10/26/2023, 11:57 AM   LOS: 0 days

## 2023-10-27 DIAGNOSIS — J181 Lobar pneumonia, unspecified organism: Secondary | ICD-10-CM | POA: Diagnosis not present

## 2023-10-27 DIAGNOSIS — A419 Sepsis, unspecified organism: Secondary | ICD-10-CM | POA: Diagnosis not present

## 2023-10-27 LAB — CBC
HCT: 38.4 % — ABNORMAL LOW (ref 39.0–52.0)
Hemoglobin: 12.8 g/dL — ABNORMAL LOW (ref 13.0–17.0)
MCH: 30.5 pg (ref 26.0–34.0)
MCHC: 33.3 g/dL (ref 30.0–36.0)
MCV: 91.4 fL (ref 80.0–100.0)
Platelets: 216 K/uL (ref 150–400)
RBC: 4.2 MIL/uL — ABNORMAL LOW (ref 4.22–5.81)
RDW: 13.3 % (ref 11.5–15.5)
WBC: 7.4 K/uL (ref 4.0–10.5)
nRBC: 0 % (ref 0.0–0.2)

## 2023-10-27 LAB — BASIC METABOLIC PANEL WITH GFR
Anion gap: 11 (ref 5–15)
BUN: 15 mg/dL (ref 8–23)
CO2: 27 mmol/L (ref 22–32)
Calcium: 8.3 mg/dL — ABNORMAL LOW (ref 8.9–10.3)
Chloride: 96 mmol/L — ABNORMAL LOW (ref 98–111)
Creatinine, Ser: 0.92 mg/dL (ref 0.61–1.24)
GFR, Estimated: >60 mL/min (ref >60)
Glucose, Bld: 115 mg/dL — ABNORMAL HIGH (ref 70–99)
Potassium: 3.8 mmol/L (ref 3.5–5.1)
Sodium: 134 mmol/L — ABNORMAL LOW (ref 135–145)

## 2023-10-27 LAB — MAGNESIUM: Magnesium: 2 mg/dL (ref 1.7–2.4)

## 2023-10-27 MED ORDER — AZITHROMYCIN 500 MG PO TABS: 500.0000 mg | ORAL_TABLET | Freq: Every day | ORAL | 0 refills | Status: AC

## 2023-10-27 MED ORDER — CEFDINIR 300 MG PO CAPS: 300.0000 mg | ORAL_CAPSULE | Freq: Two times a day (BID) | ORAL | 0 refills | Status: AC

## 2023-10-27 NOTE — Discharge Summary (Signed)
 Physician Discharge Summary   Patient: Luis Fletcher MRN: [id number] DOB: [birth year]  Admit date:     10/25/2023  Discharge date: 10/27/23  Discharge Physician: Alm Celie Desrochers   PCP: Aimee Cons, MD   Recommendations at discharge:   Please follow up with primary care provider within 1-2 weeks  Please repeat BMP and CBC in one week    Hospital Course: 89 year old male with history of coronary disease status post CABG 1998, hypertension, hyperlipidemia, anxiety/depression presenting with 3-day history of generalized weakness and poor p.o. intake.  He states that he began developing some left lower rib pain after changing some tires on his car on 10/22/2023.  He denies any frank shortness of breath or coughing or hemoptysis.  He denies any fevers, chills, headache, chest pain, nausea, vomiting, diarrhea, abdominal pain, dysuria, hematuria.  Has had some loose stools over the past 3 days.  He quit smoking in 1961.  In the early morning 10/25/2023, the patient was feeling unsteady and weak going to the bathroom.  He fell, back to the bedroom.  He subsequently laid on the bed until the morning.  He eventually was strong enough to get up and unlock the door for his friend who came to visit him and check up on him.  EMS was activated when it was noticed the patient had a laceration on his scalp.  The patient denied loss of consciousness.  He denies any new medications. In the ED, the patient was febrile up to 101.0 F.  He was hemodynamically stable with oxygen saturation 94-97% room air.  WBC 12.6, hemoglobin 13.2, platelets 223.  Sodium 134, potassium 4.1, bicarbonate 26, serum creatinine 0.84.  LFTs were unremarkable.  Lipase 25.  Lactic acid 1.4.  Troponin 11>> 11.  CT chest abdomen and pelvis showed lateral left upper lobe and lingular consolidations.  There was cholelithiasis without any ductal dilatation.  There was diverticulosis.  CT of the brain was negative for any acute intracranial findings.  CT of  the cervical spine was negative for fracture or listhesis.  The patient was started on ceftriaxone .  He was admitted for further evaluation and treatment.  Assessment and Plan:  Sepsis  - Presented with fever and leukocytosis - Secondary to pneumonia - Lactic acid 1.4 - blood cultures remain negative - Continue ceftriaxone  - continued azithromycin  - Follow blood cultures--neg to date - UA 6-10 WBC - clinically improving--sepsis physiology improved - viral resp panel neg   Lobar pneumonia - 10/25/2023 CT chest as discussed above - Check PCT < 0.10 - Urine Streptococcus pneumonia antigen--neg - urine legionella--pending at time of dc -Continue ceftriaxone  and azithromycin  - d/c home with 4 more days cefdinir  and azithro   Coronary disease -No chest pain presently - Troponin 11>>11   Mixed hyperlipidemia - Continue statin   Hypothyroidism - Continue Synthroid    Anxiety/depression - Continue BuSpar  and Paxil    Generalized weakness - PT>>SNF>>pt prefers to go home - B12--694 - folate 15.8 -TSH  1478       Consultants: none Procedures performed: none  Disposition: Home Diet recommendation:  Regular diet DISCHARGE MEDICATION: Allergies as of 10/27/2023       Reactions   Penicillin G Other (See Comments)   Unknown   Sulfa Antibiotics Rash        Medication List     TAKE these medications    aspirin  EC 81 MG tablet Take 81 mg by mouth daily.   azithromycin  500 MG tablet Commonly known as: ZITHROMAX  Take 1  tablet (500 mg total) by mouth daily. Start taking on: October 28, 2023   busPIRone  5 MG tablet Commonly known as: BUSPAR  Take 5 mg by mouth at bedtime.   cefdinir  300 MG capsule Commonly known as: OMNICEF  Take 1 capsule (300 mg total) by mouth 2 (two) times daily.   Cholecalciferol 100 MCG (4000 UT) Caps Take 1 tablet by mouth daily.   levothyroxine  100 MCG tablet Commonly known as: SYNTHROID  Take 100 mcg by mouth daily before breakfast.    PARoxetine  30 MG tablet Commonly known as: PAXIL  Take 1 tablet by mouth at bedtime.   vitamin B-12 100 MCG tablet Commonly known as: CYANOCOBALAMIN Take 100 mcg by mouth daily.        Discharge Exam: Filed Weights   10/25/23 2130  Weight: 69.5 kg   HEENT:  [place]/AT, No thrush, no icterus CV:  RRR, no rub, no S3, no S4 Lung:  bibasilar rales.  Diminished BX Abd:  soft/+BS, NT Ext:  No edema, no lymphangitis, no synovitis, no rash   Condition at discharge: stable  The results of significant diagnostics from this hospitalization (including imaging, microbiology, ancillary and laboratory) are listed below for reference.   Imaging Studies: CT CHEST ABDOMEN PELVIS WO CONTRAST Result Date: 10/25/2023 CLINICAL DATA:  Abdominal/flank pain, stone suspected EXAM: CT CHEST, ABDOMEN AND PELVIS WITHOUT CONTRAST TECHNIQUE: Multidetector CT imaging of the chest, abdomen and pelvis was performed following the standard protocol without IV contrast. RADIATION DOSE REDUCTION: This exam was performed according to the departmental dose-optimization program which includes automated exposure control, adjustment of the mA and/or kV according to patient size and/or use of iterative reconstruction technique. COMPARISON:  March 02, 2023 FINDINGS: Of note, the lack of intravenous contrast limits evaluation of the solid organ parenchyma and vascularity. CT CHEST FINDINGS Cardiovascular: No cardiomegaly or pericardial effusion.No aortic aneurysm. Diffuse aortic atherosclerosis with dense multi-vessel coronary atherosclerosis. Postsurgical changes of a prior CABG procedure. Mediastinum/Nodes: No mediastinal mass.No mediastinal, hilar, or axillary lymphadenopathy. Lungs/Pleura: The midline trachea and bronchi are patent. Biapical pleuroparenchymal scarring. Fibrolinear scarring within the lingula and both lower lobes. Focal airspace consolidation with air bronchograms dependently and laterally within the left upper  lobe and lingula. No pleural effusion or pneumothorax. Scattered calcified granulomas. 2 mm anterior left upper lobe nodule (axial 53), likely infectious or inflammatory. CT ABDOMEN PELVIS FINDINGS Hepatobiliary: No mass.Multiple small radiopaque gallstones. No wall thickening. No intrahepatic or extrahepatic biliary ductal dilation. Pancreas: No mass or main ductal dilation. No peripancreatic inflammation or fluid collection. Spleen: Normal size. No mass. Adrenals/Urinary Tract: No adrenal masses. No renal mass. No hydronephrosis or nephrolithiasis. The urinary bladder is distended without focal abnormality. Stomach/Bowel: The stomach is decompressed without focal abnormality. No small bowel wall thickening or inflammation. No small bowel obstruction.Normal appendix. Descending and sigmoid colonic diverticulosis. No changes of acute diverticulitis. Vascular/Lymphatic: No aortic aneurysm. Diffuse aortoiliac atherosclerosis. No intraabdominal or pelvic lymphadenopathy. Reproductive: Borderline prostatomegaly.  No free pelvic fluid. Other: No pneumoperitoneum, ascites, or mesenteric inflammation. Musculoskeletal: No acute fracture or destructive lesion. Multilevel degenerative disc disease of the spine. Sternotomy wires. Osteopenia. 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 Postsurgical changes along the right inguinal region possibly due to interval hernia repair. No associated fluid collection. IMPRESSION: 1. Lateral left upper lobe and lingular airspace consolidation, favored to represent left upper lobe pneumonia. No parapneumonic effusion. 2. Cholecystolithiasis.  No changes of acute cholecystitis. 3. Descending and sigmoid colonic diverticulosis. No changes of acute diverticulitis. 4. Borderline prostatomegaly. Aortic Atherosclerosis (ICD10-I70.0). Electronically Signed  By: Rogelia Myers M.D.   On: 10/25/2023 14:05   CT Head Wo Contrast Result Date: 10/25/2023 CLINICAL DATA:  Provided history: Head  trauma, minor. Neck trauma. Additional history provided: Fall (with head trauma). Scalp laceration. EXAM: CT HEAD WITHOUT CONTRAST CT CERVICAL SPINE WITHOUT CONTRAST TECHNIQUE: Multidetector CT imaging of the head and cervical spine was performed following the standard protocol without intravenous contrast. Multiplanar CT image reconstructions of the cervical spine were also generated. RADIATION DOSE REDUCTION: This exam was performed according to the departmental dose-optimization program which includes automated exposure control, adjustment of the mA and/or kV according to patient size and/or use of iterative reconstruction technique. COMPARISON:  None. FINDINGS: CT HEAD FINDINGS Brain: Mild generalized cerebral atrophy. There is no acute intracranial hemorrhage. No demarcated cortical infarct. No extra-axial fluid collection. No evidence of an intracranial mass. No midline shift. Vascular: No hyperdense vessel.  Atherosclerotic calcifications. Skull: No calvarial fracture or aggressive osseous lesion. Sinuses/Orbits: No mass or acute finding within the imaged orbits. No significant paranasal sinus disease at the imaged levels. Other: Left frontoparietal scalp laceration. CT CERVICAL SPINE FINDINGS Alignment: Slight grade 1 anterolisthesis at C2-C3, C3-C4 and C4-C5. 4 mm C5-C6 grade 1 anterolisthesis. 3 mm C6-C7 grade 1 anterolisthesis. Slight C7-T1 grade 1 anterolisthesis. Skull base and vertebrae: The basion-dental and atlanto-dental intervals are maintained.No evidence of acute fracture to the cervical spine. Soft tissues and spinal canal: No prevertebral fluid or swelling. No visible canal hematoma. Disc levels: Cervical spondylosis with multilevel disc space narrowing, disc bulges/central disc protrusions, uncovertebral hypertrophy and facet arthropathy. No appreciable high-grade spinal canal stenosis. Multilevel bony neural foraminal narrowing. Multilevel ventral osteophytes (including a fairly bulky  bridging ventral osteophyte at C6-C7). Upper chest: No consolidation within the imaged lung apices. No visible pneumothorax. IMPRESSION: CT head: 1.  No evidence of an acute intracranial abnormality. 2. Left frontoparietal scalp laceration. 3. Mild generalized cerebral atrophy. CT cervical spine: 1. No evidence of an acute cervical spine fracture. 2. Grade 1 anterolisthesis at C2-C3, C3-C4, C4-C5, C5-C6, C6-C7 and C7-T1. 3. Cervical spondylosis as described. Electronically Signed   By: Rockey Childs D.O.   On: 10/25/2023 11:26   CT Cervical Spine Wo Contrast Result Date: 10/25/2023 CLINICAL DATA:  Provided history: Head trauma, minor. Neck trauma. Additional history provided: Fall (with head trauma). Scalp laceration. EXAM: CT HEAD WITHOUT CONTRAST CT CERVICAL SPINE WITHOUT CONTRAST TECHNIQUE: Multidetector CT imaging of the head and cervical spine was performed following the standard protocol without intravenous contrast. Multiplanar CT image reconstructions of the cervical spine were also generated. RADIATION DOSE REDUCTION: This exam was performed according to the departmental dose-optimization program which includes automated exposure control, adjustment of the mA and/or kV according to patient size and/or use of iterative reconstruction technique. COMPARISON:  None. FINDINGS: CT HEAD FINDINGS Brain: Mild generalized cerebral atrophy. There is no acute intracranial hemorrhage. No demarcated cortical infarct. No extra-axial fluid collection. No evidence of an intracranial mass. No midline shift. Vascular: No hyperdense vessel.  Atherosclerotic calcifications. Skull: No calvarial fracture or aggressive osseous lesion. Sinuses/Orbits: No mass or acute finding within the imaged orbits. No significant paranasal sinus disease at the imaged levels. Other: Left frontoparietal scalp laceration. CT CERVICAL SPINE FINDINGS Alignment: Slight grade 1 anterolisthesis at C2-C3, C3-C4 and C4-C5. 4 mm C5-C6 grade 1  anterolisthesis. 3 mm C6-C7 grade 1 anterolisthesis. Slight C7-T1 grade 1 anterolisthesis. Skull base and vertebrae: The basion-dental and atlanto-dental intervals are maintained.No evidence of acute fracture to the cervical spine. Soft tissues  and spinal canal: No prevertebral fluid or swelling. No visible canal hematoma. Disc levels: Cervical spondylosis with multilevel disc space narrowing, disc bulges/central disc protrusions, uncovertebral hypertrophy and facet arthropathy. No appreciable high-grade spinal canal stenosis. Multilevel bony neural foraminal narrowing. Multilevel ventral osteophytes (including a fairly bulky bridging ventral osteophyte at C6-C7). Upper chest: No consolidation within the imaged lung apices. No visible pneumothorax. IMPRESSION: CT head: 1.  No evidence of an acute intracranial abnormality. 2. Left frontoparietal scalp laceration. 3. Mild generalized cerebral atrophy. CT cervical spine: 1. No evidence of an acute cervical spine fracture. 2. Grade 1 anterolisthesis at C2-C3, C3-C4, C4-C5, C5-C6, C6-C7 and C7-T1. 3. Cervical spondylosis as described. Electronically Signed   By: Rockey Childs D.O.   On: 10/25/2023 11:26   DG Chest 2 View Result Date: 10/25/2023 CLINICAL DATA:  Shortness of breath. EXAM: CHEST - 2 VIEW COMPARISON:  None Available. FINDINGS: The heart size and mediastinal contours are within normal limits. Right lung is clear. Mild left basilar scarring is noted. Status post coronary artery bypass graft. No acute osseous abnormality is noted. IMPRESSION: No active cardiopulmonary disease. Electronically Signed   By: Lynwood Landy Raddle M.D.   On: 10/25/2023 11:10    Microbiology: Results for orders placed or performed during the hospital encounter of 10/25/23  Resp panel by RT-PCR (RSV, Flu A&B, Covid) Anterior Nasal Swab     Status: None   Collection Time: 10/25/23 10:29 AM   Specimen: Anterior Nasal Swab  Result Value Ref Range Status   SARS Coronavirus 2 by RT  PCR NEGATIVE NEGATIVE Final    Comment: (NOTE) SARS-CoV-2 target nucleic acids are NOT DETECTED.  The SARS-CoV-2 RNA is generally detectable in upper respiratory specimens during the acute phase of infection. The lowest concentration of SARS-CoV-2 viral copies this assay can detect is 138 copies/mL. A negative result does not preclude SARS-Cov-2 infection and should not be used as the sole basis for treatment or other patient management decisions. A negative result may occur with  improper specimen collection/handling, submission of specimen other than nasopharyngeal swab, presence of viral mutation(s) within the areas targeted by this assay, and inadequate number of viral copies(<138 copies/mL). A negative result must be combined with clinical observations, patient history, and epidemiological information. The expected result is Negative.  Fact Sheet for Patients:  BloggerCourse.com  Fact Sheet for Healthcare Providers:  SeriousBroker.it  This test is no t yet approved or cleared by the [place]  FDA and  has been authorized for detection and/or diagnosis of SARS-CoV-2 by FDA under an Emergency Use Authorization (EUA). This EUA will remain  in effect (meaning this test can be used) for the duration of the COVID-19 declaration under Section 564(b)(1) of the Act, 21 U.S.C.section 360bbb-3(b)(1), unless the authorization is terminated  or revoked sooner.       Influenza A by PCR NEGATIVE NEGATIVE Final   Influenza B by PCR NEGATIVE NEGATIVE Final    Comment: (NOTE) The Xpert Xpress SARS-CoV-2/FLU/RSV plus assay is intended as an aid in the diagnosis of influenza from Nasopharyngeal swab specimens and should not be used as a sole basis for treatment. Nasal washings and aspirates are unacceptable for Xpert Xpress SARS-CoV-2/FLU/RSV testing.  Fact Sheet for Patients: BloggerCourse.com  Fact Sheet for  Healthcare Providers: SeriousBroker.it  This test is not yet approved or cleared by the [place]  FDA and has been authorized for detection and/or diagnosis of SARS-CoV-2 by FDA under an Emergency Use Authorization (EUA). This EUA will remain in  effect (meaning this test can be used) for the duration of the COVID-19 declaration under Section 564(b)(1) of the Act, 21 U.S.C. section 360bbb-3(b)(1), unless the authorization is terminated or revoked.     Resp Syncytial Virus by PCR NEGATIVE NEGATIVE Final    Comment: (NOTE) Fact Sheet for Patients: BloggerCourse.com  Fact Sheet for Healthcare Providers: SeriousBroker.it  This test is not yet approved or cleared by the [place]  FDA and has been authorized for detection and/or diagnosis of SARS-CoV-2 by FDA under an Emergency Use Authorization (EUA). This EUA will remain in effect (meaning this test can be used) for the duration of the COVID-19 declaration under Section 564(b)(1) of the Act, 21 U.S.C. section 360bbb-3(b)(1), unless the authorization is terminated or revoked.  Performed at [facility], [address], [place], [place] [address]   Urine Culture     Status: None   Collection Time: 10/25/23 11:53 AM   Specimen: Urine, Clean Catch  Result Value Ref Range Status   Specimen Description   Final    URINE, CLEAN CATCH Performed at [facility], [address] [place], [place] [address]    Special Requests   Final    NONE Performed at [facility], [address] [place], [place] [address]    Culture   Final    NO GROWTH Performed at [facility], [address], [place], [place] [address]    Report Status 10/26/2023 FINAL  Final  Culture, blood (routine x 2)     Status: None (Preliminary result)   Collection Time: 10/25/23 12:16 PM   Specimen: BLOOD  Result Value Ref Range Status   Specimen Description BLOOD BLOOD RIGHT ARM   Final   Special Requests   Final    BOTTLES DRAWN AEROBIC AND ANAEROBIC Blood Culture adequate volume   Culture   Final    NO GROWTH 2 DAYS Performed at [facility], [address], [place], [place] [address]    Report Status PENDING  Incomplete  Culture, blood (routine x 2)     Status: None (Preliminary result)   Collection Time: 10/25/23 12:21 PM   Specimen: BLOOD  Result Value Ref Range Status   Specimen Description BLOOD BLOOD RIGHT HAND  Final   Special Requests   Final    Blood Culture adequate volume BOTTLES DRAWN AEROBIC ONLY   Culture   Final    NO GROWTH 2 DAYS Performed at [facility], [address] [place], [place] [address]    Report Status PENDING  Incomplete  Respiratory (~20 pathogens) panel by PCR     Status: None   Collection Time: 10/26/23  5:00 AM   Specimen: Nasopharyngeal Swab; Respiratory  Result Value Ref Range Status   Adenovirus NOT DETECTED NOT DETECTED Final   Coronavirus 229E NOT DETECTED NOT DETECTED Final    Comment: (NOTE) The Coronavirus on the Respiratory Panel, DOES NOT test for the novel  Coronavirus (2019 nCoV)    Coronavirus HKU1 NOT DETECTED NOT DETECTED Final   Coronavirus NL63 NOT DETECTED NOT DETECTED Final   Coronavirus OC43 NOT DETECTED NOT DETECTED Final   Metapneumovirus NOT DETECTED NOT DETECTED Final   Rhinovirus / Enterovirus NOT DETECTED NOT DETECTED Final   Influenza A NOT DETECTED NOT DETECTED Final   Influenza B NOT DETECTED NOT DETECTED Final   Parainfluenza Virus 1 NOT DETECTED NOT DETECTED Final   Parainfluenza Virus 2 NOT DETECTED NOT DETECTED Final   Parainfluenza Virus 3 NOT DETECTED NOT DETECTED Final  Parainfluenza Virus 4 NOT DETECTED NOT DETECTED Final   Respiratory Syncytial Virus NOT DETECTED NOT DETECTED Final   Bordetella pertussis NOT DETECTED NOT DETECTED Final   Bordetella Parapertussis NOT DETECTED NOT DETECTED Final   Chlamydophila pneumoniae NOT DETECTED NOT DETECTED Final   Mycoplasma pneumoniae  NOT DETECTED NOT DETECTED Final    Comment: Performed at [facility], [address], [place], [place] [address]    Labs: CBC: Recent Labs  Lab 10/25/23 1052 10/26/23 0430 10/27/23 0933  WBC 12.6* 10.7* 7.4  NEUTROABS 10.6*  --   --   HGB 13.2 12.8* 12.8*  HCT 40.8 39.4 38.4*  MCV 92.1 94.3 91.4  PLT 223 202 216   Basic Metabolic Panel: Recent Labs  Lab 10/25/23 1052 10/26/23 0430 10/27/23 0933  NA 134* 134* 134*  K 4.1 3.5 3.8  CL 96* 99 96*  CO2 26 23 27   GLUCOSE 136* 114* 115*  BUN 13 15 15   CREATININE 0.84 0.90 0.92  CALCIUM 8.5* 8.1* 8.3*  MG  --  2.1 2.0   Liver Function Tests: Recent Labs  Lab 10/25/23 1052  AST 19  ALT 15  ALKPHOS 20*  BILITOT 0.7  PROT 7.0  ALBUMIN 3.3*   CBG: Recent Labs  Lab 10/25/23 1134  GLUCAP 121*    Discharge time spent: greater than 30 minutes.  Signed: Alm Schneider, MD [facility] 10/27/2023

## 2023-10-30 LAB — CULTURE, BLOOD (ROUTINE X 2)
Culture: NO GROWTH
Culture: NO GROWTH
Special Requests: ADEQUATE
Special Requests: ADEQUATE

## 2023-10-31 LAB — LEGIONELLA PNEUMOPHILA SEROGP 1 UR AG
L. pneumophila Serogp 1 Ur Ag: POSITIVE — AB
Source of Sample: 0

## 2023-10-31 NOTE — ED Notes (Signed)
 Lab called and wanted to give a result and CN informed them that they were d/c don a different floor. [id number] 10/31/2023

## 2023-12-11 ENCOUNTER — Emergency Department (HOSPITAL_COMMUNITY)
Admission: EM | Admit: 2023-12-11 | Discharge: 2023-12-11 | Disposition: A | Discharge status: Home / Self Care | Attending: Emergency Medicine | Admitting: Emergency Medicine

## 2023-12-11 ENCOUNTER — Other Ambulatory Visit: Payer: Self-pay

## 2023-12-11 ENCOUNTER — Encounter (HOSPITAL_COMMUNITY): Payer: Self-pay

## 2023-12-11 DIAGNOSIS — Z7982 Long term (current) use of aspirin: Secondary | ICD-10-CM | POA: Insufficient documentation

## 2023-12-11 DIAGNOSIS — T162XXA Foreign body in left ear, initial encounter: Secondary | ICD-10-CM | POA: Insufficient documentation

## 2023-12-11 DIAGNOSIS — T161XXA Foreign body in right ear, initial encounter: Secondary | ICD-10-CM | POA: Diagnosis present

## 2023-12-11 DIAGNOSIS — W448XXA Other foreign body entering into or through a natural orifice, initial encounter: Secondary | ICD-10-CM | POA: Diagnosis not present

## 2023-12-11 DIAGNOSIS — H6122 Impacted cerumen, left ear: Secondary | ICD-10-CM | POA: Diagnosis not present

## 2023-12-11 NOTE — ED Provider Notes (Signed)
 [facility] EMERGENCY DEPARTMENT AT [facility] Provider Note   CSN: [id number] Arrival date & time: 12/11/23  1207     History Chief Complaint  Patient presents with   Foreign Body in Ear    HPI: Luis Fletcher is a 89 y.o. male with history pertinent for decreased hearing who presents complaining of foreign bodies in his bilateral ears. Patient arrived via POV.  History provided by patient.  No interpreter required during this encounter.  Patient reports that he has a history of decreased hearing and previously used hearing aids.  Reports that he has not had them for some time, reports the hearing aids stopped working while his wife was 6/9, and he has not yet had a chance to get them replaced.  Reports that he went to a clinic to get my ears cleaned and they told him that they be unable to clean his ears because he had a piece of his old hearing aids his bilateral ears.  Reports that he does not know how long the pieces of been in his ears as he never specifically noticed that any pieces were missing from his hearing aids.  Denies any fever, chills, ear pain, other symptoms.  Patient's recorded medical, surgical, social, medication list and allergies were reviewed in the Snapshot window as part of the initial history.   Prior to Admission medications   Medication Sig Start Date End Date Taking? Authorizing Provider  aspirin  EC 81 MG tablet Take 81 mg by mouth daily.    [provider]  azithromycin  (ZITHROMAX ) 500 MG tablet Take 1 tablet (500 mg total) by mouth daily. 10/28/23   Evonnie Lenis, MD  busPIRone  (BUSPAR ) 5 MG tablet Take 5 mg by mouth at bedtime.    [provider]  cefdinir  (OMNICEF ) 300 MG capsule Take 1 capsule (300 mg total) by mouth 2 (two) times daily. 10/27/23   Evonnie Lenis, MD  Cholecalciferol 100 MCG (4000 UT) CAPS Take 1 tablet by mouth daily.    [provider]  levothyroxine  (SYNTHROID ) 100 MCG tablet Take 100 mcg by mouth  daily before breakfast. 09/08/17   [provider]  PARoxetine  (PAXIL ) 30 MG tablet Take 1 tablet by mouth at bedtime. 12/20/17   [provider]  vitamin B-12 (CYANOCOBALAMIN) 100 MCG tablet Take 100 mcg by mouth daily.    [provider]     Allergies: Penicillin g, Iodinated contrast media, and Sulfa antibiotics   Review of Systems   ROS as per HPI  Physical Exam Updated Vital Signs BP (!) 147/66   Pulse 66   Temp 98 F (36.7 C) (Oral)   Resp 17   Ht 5' 7 (1.702 m)   Wt 69.5 kg   SpO2 97%   BMI 24.00 kg/m  Physical Exam Vitals and nursing note reviewed.  Constitutional:      General: He is not in acute distress.    Appearance: Normal appearance.  HENT:     Head: Normocephalic and atraumatic.     Right Ear: Tympanic membrane normal. A foreign body (White foreign body visualized in the [facility]) is present.     Left Ear: Tympanic membrane normal. There is impacted cerumen (Significant wax burden visualized at the 4 o'clock position of the EAC).  Eyes:     Extraocular Movements: Extraocular movements intact.  Cardiovascular:     Rate and Rhythm: Normal rate.     Pulses: Normal pulses.  Pulmonary:     Effort: Pulmonary effort  is normal.  Skin:    General: Skin is warm and dry.     Capillary Refill: Capillary refill takes less than 2 seconds.  Neurological:     General: No focal deficit present.     Mental Status: He is alert and oriented to person, place, and time.     ED Course/ Medical Decision Making/ A&P    Procedures .Foreign Body Removal  Date/Time: 12/11/2023 1:38 PM  Performed by: Rogelia Jerilynn RAMAN, MD Authorized by: Rogelia Jerilynn RAMAN, MD  Consent: Verbal consent obtained Risks and benefits: risks, benefits and alternatives were discussed Consent given by: patient Patient understanding: patient states understanding of the procedure being performed Patient identity confirmed: verbally with patient Time out: Immediately prior to  procedure a time out was called to verify the correct patient, procedure, equipment, support staff and site/side marked as required. Body area: ear Location details: right ear  Sedation: Patient sedated: no  Patient restrained: no Patient cooperative: yes Localization method: visualized Removal mechanism: alligator forceps Complexity: simple 1 objects recovered. Objects recovered: Small white plastic foreign body Post-procedure assessment: foreign body removed Patient tolerance: patient tolerated the procedure well with no immediate complications  .Foreign Body Removal  Date/Time: 12/11/2023 1:39 PM  Performed by: Rogelia Jerilynn RAMAN, MD Authorized by: Rogelia Jerilynn RAMAN, MD  Consent: Verbal consent obtained Risks and benefits: risks, benefits and alternatives were discussed Consent given by: patient Patient understanding: patient states understanding of the procedure being performed Patient identity confirmed: verbally with patient Body area: ear Location details: left ear  Sedation: Patient sedated: no  Patient restrained: no Patient cooperative: yes Localization method: ENT speculum Removal mechanism: ear scoop Complexity: simple 1 objects recovered. Objects recovered: Cerumen with attached small white plastic foreign body Post-procedure assessment: foreign body removed Patient tolerance: patient tolerated the procedure well with no immediate complications     Medications Ordered in ED Medications - No data to display  Medical Decision Making:   Luis Fletcher is a 89 y.o. male who presents for foreign bodies in ears as per above.  Physical exam is pertinent for white plastic foreign body visualized in right ear, and cerumen visualized in left ear at the 4 o'clock position.   The differential includes but is not limited to foreign body, AOM, mastoiditis, cerumen impaction.  Independent historian: None  External data reviewed: No pertinent external  data  Labs: Not indicated  Radiology: Not indicated No results found.  EKG/Medicine tests: Not indicated EKG Interpretation:    Interventions: None  See the EMR for full details regarding lab and imaging results.  Patient presents for concern for foreign body visualized at outside facility.  Patient has foreign body visualized in right ear, did have cerumen visualized in left ear.  No evidence of AOM, no mastoid erythema or tenderness, no other concerns or symptoms on history or physical.  Foreign body removed from right ear with alligator forceps as per procedure note above.  Attempted to remove cerumen from left ear as per procedure note above, and after this was removed, there was a small piece of white plastic noted to be lodged on in the cerumen.  Photo above in procedure section.  Patient's ears reexamined after removal and no residual foreign bodies, cerumen, TMs WNL.  Recommended following up with PCP and hearing aid provider.  Presentation is most consistent with acute uncomplicated illness  Discussion of management or test interpretations with external provider(s): Not indicated  Risk Drugs:None  Disposition: DISCHARGE: I believe that the  patient is safe for discharge home with outpatient follow-up. Patient was informed of all pertinent physical exam, laboratory, and imaging findings. Patient's suspected etiology of their symptom presentation was discussed with the patient and all questions were answered. We discussed following up with PCP and hearing aid provider. I provided thorough ED return precautions. The patient feels safe and comfortable with this plan.  MDM generated using voice dictation software and may contain dictation errors.  Please contact me for any clarification or with any questions.  Clinical Impression:  1. Ear foreign body, left, initial encounter   2. Ear foreign body, right, initial encounter      Discharge   Final Clinical Impression(s) / ED  Diagnoses Final diagnoses:  Ear foreign body, left, initial encounter  Ear foreign body, right, initial encounter    Rx / DC Orders ED Discharge Orders     None        Rogelia Jerilynn RAMAN, MD 12/11/23 1355

## 2023-12-11 NOTE — Discharge Instructions (Signed)
 [facility]  Thank you for allowing us  to take care of you today.  You came to the Emergency Department today because you went to an outside facility to have your ears cleaned and you told that you had pieces of your hearing aid stuck in your bilateral ears.  Here in the emergency department we removed a white piece of plastic from your right ear as well as wax and a white piece of plastic from your left ear.  Your ears now do not have any foreign bodies or wax in them.  We recommend following up with your usual hearing aid provider for a new set of hearing aids.  To-Do: 1. Please follow-up with your primary doctor within 1 to 2 weeks/ as soon as possible.   Please return to the Emergency Department or call 911 if you experience have worsening of your symptoms, or do not get better, difficulty hearing, chest pain, shortness of breath, severe or significantly worsening pain, high fever, severe confusion, pass out or have any reason to think that you need emergency medical care.   We hope you feel better soon.   Mitzie Later, MD Department of Emergency Medicine [facility]

## 2023-12-11 NOTE — ED Triage Notes (Signed)
 Pt arrived via POV c/o bilateral foreign objects lodged in both of his ears. Pt reports he went to remove his hearing aides and noticed a little white piece had dislodged from his hearing aides and are stuck in his ears.
